# Patient Record
Sex: Female | Born: 1980 | Race: Black or African American | Hispanic: No | Marital: Single | Smoking: Current every day smoker
Health system: Southern US, Community
[De-identification: ages and names within clinical notes are randomized; demographics above are authoritative.]

## PROBLEM LIST (undated history)

## (undated) DIAGNOSIS — N739 Female pelvic inflammatory disease, unspecified: Secondary | ICD-10-CM

## (undated) DIAGNOSIS — R569 Unspecified convulsions: Secondary | ICD-10-CM

## (undated) HISTORY — PX: OTHER SURGICAL HISTORY: SHX169

## (undated) HISTORY — PX: TUBAL LIGATION: SHX77

## (undated) NOTE — ED Triage Notes (Signed)
 Formatting of this note might be different from the original. Patient thinks she has a pinched nerve in her right hip that is causing pain that radiates down her right leg it started last week but was on a cruise and unable to be seen . Pain increases with sitting Electronically signed by Genia Mering, RN at 10/26/2021 11:10 AM EST

## (undated) NOTE — ED Provider Notes (Signed)
 Formatting of this note is different from the original. HPI 38 year old female presents ED complaining of right lateral hip pain radiating to the lateral thigh and lateral proximal lower leg.  No weakness or numbness lower extremity.  No fall or trauma.  Patient awoke with this discomfort after an extended day of activity walking and caring for her children.  Similar symptoms in past.  History reviewed. No pertinent past medical history.  History reviewed. No pertinent surgical history.  History reviewed. No pertinent family history.  Social History   Socioeconomic History   Marital status: UNKNOWN    Spouse name: Not on file   Number of children: Not on file   Years of education: Not on file   Highest education level: Not on file  Occupational History   Not on file  Tobacco Use   Smoking status: Every Day    Types: Cigarettes   Smokeless tobacco: Never  Substance and Sexual Activity   Alcohol use: Yes    Comment: occasionally   Drug use: Never   Sexual activity: Not on file  Other Topics Concern   Not on file  Social History Narrative   Not on file   Social Determinants of Health   Financial Resource Strain: Not on file  Food Insecurity: Not on file  Transportation Needs: Not on file  Physical Activity: Not on file  Stress: Not on file  Social Connections: Not on file  Intimate Partner Violence: Not on file  Housing Stability: Not on file   ALLERGIES: Patient has no known allergies.  Review of Systems  All other systems reviewed and are negative.  Vitals:   10/26/21 1111  BP: (!) 164/90  Pulse: 89  Resp: 22  Temp: 98.6 F (37 C)  SpO2: 100%  Weight: 123.4 kg (272 lb)  Height: 5' 9 (1.753 m)   Middle-age AA female moderate discomfort Physical Exam Vitals and nursing note reviewed.  Constitutional:      General: She is not in acute distress.    Appearance: Normal appearance. She is normal weight. She is not ill-appearing.  HENT:     Head:  Normocephalic and atraumatic.  Abdominal:     General: Abdomen is flat. Bowel sounds are normal. There is no distension.     Palpations: Abdomen is soft.     Tenderness: There is no abdominal tenderness. There is no guarding.  Musculoskeletal:        General: Tenderness present. No swelling, deformity or signs of injury. Normal range of motion.     Cervical back: Normal range of motion and neck supple.     Comments: The right greater trochanter is tender to palpation flexion of the thigh with internal rotation causes significant pain; the hip joint otherwise has a normal range of motion there is no warmth erythema or inflammation the overlying skin.  The right knee is normal exam of the right ankle normal the lower extremity is otherwise neurovascular intact with no evidence of edema negative Homans no inner thigh tenderness  Neurological:     Mental Status: She is alert.    MDM Clinically with lateral band or greater trochanteric bursitis we will plan for steroid injection.   Procedures Injection of right greater trochanter bursa: Prepped with chlorhexidine verbal consent injected with 40 mg of Depo-Medrol mixed with 2 cc of Marcaine  patient tolerated well     Electronically signed by Claudene Helene BIRCH, MD at 10/26/2021  1:16 PM EST

---

## 2013-07-22 ENCOUNTER — Emergency Department (HOSPITAL_COMMUNITY)
Admission: EM | Admit: 2013-07-22 | Discharge: 2013-07-22 | Disposition: A | Discharge status: Home / Self Care | Payer: Medicaid Other | Attending: Emergency Medicine | Admitting: Emergency Medicine

## 2013-07-22 ENCOUNTER — Encounter (HOSPITAL_COMMUNITY): Payer: Self-pay | Admitting: Emergency Medicine

## 2013-07-22 DIAGNOSIS — F17200 Nicotine dependence, unspecified, uncomplicated: Secondary | ICD-10-CM | POA: Insufficient documentation

## 2013-07-22 DIAGNOSIS — Z3202 Encounter for pregnancy test, result negative: Secondary | ICD-10-CM | POA: Insufficient documentation

## 2013-07-22 DIAGNOSIS — Z8669 Personal history of other diseases of the nervous system and sense organs: Secondary | ICD-10-CM | POA: Insufficient documentation

## 2013-07-22 DIAGNOSIS — N390 Urinary tract infection, site not specified: Secondary | ICD-10-CM | POA: Insufficient documentation

## 2013-07-22 HISTORY — DX: Unspecified convulsions: R56.9

## 2013-07-22 LAB — URINALYSIS, ROUTINE W REFLEX MICROSCOPIC
Bilirubin Urine: NEGATIVE
Ketones, ur: NEGATIVE mg/dL
Nitrite: NEGATIVE
Protein, ur: NEGATIVE mg/dL
Urobilinogen, UA: 0.2 mg/dL (ref 0.0–1.0)

## 2013-07-22 MED ORDER — CEPHALEXIN 500 MG PO CAPS
500.0000 mg | ORAL_CAPSULE | Freq: Once | ORAL | Status: AC
  Administered 2013-07-22: 500 mg via ORAL
  Filled 2013-07-22: qty 1

## 2013-07-22 MED ORDER — FLUCONAZOLE 200 MG PO TABS: 200.0000 mg | ORAL_TABLET | Freq: Every day | ORAL | Status: DC

## 2013-07-22 MED ORDER — CEPHALEXIN 500 MG PO CAPS: 500.0000 mg | ORAL_CAPSULE | Freq: Two times a day (BID) | ORAL | Status: DC

## 2013-07-22 NOTE — ED Provider Notes (Signed)
CSN: [id number]     Arrival date & time 07/22/13  0631 History     First MD Initiated Contact with Patient 07/22/13 [phone]     Chief Complaint  Patient presents with  . Flank Pain   (Consider location/radiation/quality/duration/timing/severity/associated sxs/prior Treatment) HPI Comments: Bilateral flank pain, onset 2 days ago. Mild hematuria. Pain constant, changes with position changes, sharp. No radiation of pain, no vomiting or nausea. No fevers. Mild diarrhea.  LMP now.   Patient is a 32 y.o. female presenting with flank pain. The history is provided by the patient.  Flank Pain This is a new problem. The current episode started 2 days ago. The problem occurs constantly. The problem has been gradually worsening. Pertinent negatives include no chest pain, no abdominal pain, no headaches and no shortness of breath. Exacerbated by: position changes. Nothing relieves the symptoms. She has tried nothing for the symptoms. The treatment provided no relief.    Past Medical History  Diagnosis Date  . Seizures     epilepsy   Past Surgical History  Procedure Laterality Date  . L eye prothesis     No family history on file. History  Substance Use Topics  . Smoking status: Current Every Day Smoker  . Smokeless tobacco: Not on file  . Alcohol Use: No   OB History   Grav Para Term Preterm Abortions TAB SAB Ect Mult Living                 Review of Systems  Constitutional: Negative for fever and chills.  Respiratory: Negative for shortness of breath.   Cardiovascular: Negative for chest pain.  Gastrointestinal: Positive for diarrhea. Negative for nausea, vomiting, abdominal pain and blood in stool.  Genitourinary: Positive for flank pain.  Neurological: Negative for headaches.  All other systems reviewed and are negative.    Allergies  Review of patient's allergies indicates no known allergies.  Home Medications   Current Outpatient Rx  Name  Route  Sig  Dispense  Refill  .  aspirin EC 325 MG tablet   Oral   Take 325 mg by mouth every 6 (six) hours as needed for pain.         Marland Kitchen ibuprofen (ADVIL,MOTRIN) 200 MG tablet   Oral   Take 800-1,000 mg by mouth every 12 (twelve) hours as needed for pain.          BP 135/70  Pulse 64  Temp(Src) 98.1 F (36.7 C) (Oral)  Resp 18  Ht 5\' 9"  (1.753 m)  Wt 205 lb 6 oz (93.157 kg)  BMI 30.31 kg/m2  SpO2 100%  LMP 07/21/2013 Physical Exam  Nursing note and vitals reviewed. Constitutional: She is oriented to person, place, and time. She appears well-developed and well-nourished. No distress.  HENT:  Head: Normocephalic and atraumatic.  Eyes: EOM are normal. Pupils are equal, round, and reactive to light.  Neck: Normal range of motion. Neck supple.  Cardiovascular: Normal rate and regular rhythm.  Exam reveals no friction rub.   No murmur heard. Pulmonary/Chest: Effort normal and breath sounds normal. No respiratory distress. She has no wheezes. She has no rales.  Abdominal: Soft. She exhibits no distension. There is tenderness (mild bilateral CVA tenderness). There is no rebound.  Musculoskeletal: Normal range of motion. She exhibits no edema.  No midline back pain, no masses, no deformities  Neurological: She is alert and oriented to person, place, and time.  Skin: She is not diaphoretic.    ED Course  Procedures (including critical care time)  Labs Reviewed  URINALYSIS, ROUTINE W REFLEX MICROSCOPIC - Abnormal; Notable for the following:    Specific Gravity, Urine 1.035 (*)    Leukocytes, UA MODERATE (*)    All other components within normal limits  URINE MICROSCOPIC-ADD ON  POCT PREGNANCY, URINE   No results found. 1. UTI (urinary tract infection)     MDM   32 year old female presents with bilateral flank pain, mild hematuria for 2 days. She denies any fever, nausea, vomiting. She has constant pain with mild alleviation with position changes. She has no history of kidney stones. She states this  feels like a previous UTI. Patient denies any previous history of kidney stones. Vitals are stable here. Exam with some mild suprapubic tenderness, she states feels like cramping to her current menstrual period. She has mild bilateral CVA tenderness. No other back pain. Her pain does not appear consistent with kidney stones. We'll check urinalysis and pregnancy test. She's not been nauseated or vomiting, do not feel she needs IV fluids at this time. UA consistent with UTI with moderate leukocytes in 7-10 white cells. We'll give Keflex, first dose here and prescription for same.    Dagmar Hait, MD 07/22/13 [phone]

## 2013-07-22 NOTE — ED Notes (Signed)
Pt c/o bilat flank pain onset Thursday with hematuria. Denies abd pain at this time. +diarrhea. Denies fever.

## 2013-08-19 ENCOUNTER — Encounter (HOSPITAL_COMMUNITY): Payer: Self-pay

## 2013-08-19 ENCOUNTER — Emergency Department (HOSPITAL_COMMUNITY)
Admission: EM | Admit: 2013-08-19 | Discharge: 2013-08-19 | Disposition: A | Discharge status: Home / Self Care | Payer: Medicaid Other | Attending: Emergency Medicine | Admitting: Emergency Medicine

## 2013-08-19 DIAGNOSIS — R110 Nausea: Secondary | ICD-10-CM | POA: Insufficient documentation

## 2013-08-19 DIAGNOSIS — Z8742 Personal history of other diseases of the female genital tract: Secondary | ICD-10-CM | POA: Insufficient documentation

## 2013-08-19 DIAGNOSIS — F17200 Nicotine dependence, unspecified, uncomplicated: Secondary | ICD-10-CM | POA: Insufficient documentation

## 2013-08-19 DIAGNOSIS — R102 Pelvic and perineal pain: Secondary | ICD-10-CM

## 2013-08-19 DIAGNOSIS — Z3202 Encounter for pregnancy test, result negative: Secondary | ICD-10-CM | POA: Insufficient documentation

## 2013-08-19 DIAGNOSIS — N949 Unspecified condition associated with female genital organs and menstrual cycle: Secondary | ICD-10-CM | POA: Insufficient documentation

## 2013-08-19 DIAGNOSIS — N898 Other specified noninflammatory disorders of vagina: Secondary | ICD-10-CM

## 2013-08-19 DIAGNOSIS — Z8669 Personal history of other diseases of the nervous system and sense organs: Secondary | ICD-10-CM | POA: Insufficient documentation

## 2013-08-19 DIAGNOSIS — N390 Urinary tract infection, site not specified: Secondary | ICD-10-CM

## 2013-08-19 HISTORY — DX: Female pelvic inflammatory disease, unspecified: N73.9

## 2013-08-19 LAB — WET PREP, GENITAL
Trich, Wet Prep: NONE SEEN
Yeast Wet Prep HPF POC: NONE SEEN

## 2013-08-19 LAB — URINALYSIS, ROUTINE W REFLEX MICROSCOPIC
Bilirubin Urine: NEGATIVE
Hgb urine dipstick: NEGATIVE
Specific Gravity, Urine: 1.026 (ref 1.005–1.030)
pH: 7 (ref 5.0–8.0)

## 2013-08-19 LAB — URINE MICROSCOPIC-ADD ON

## 2013-08-19 LAB — POCT PREGNANCY, URINE: Preg Test, Ur: NEGATIVE

## 2013-08-19 MED ORDER — OXYCODONE-ACETAMINOPHEN 5-325 MG PO TABS
2.0000 | ORAL_TABLET | Freq: Once | ORAL | Status: AC
  Administered 2013-08-19: 2 via ORAL
  Filled 2013-08-19: qty 2

## 2013-08-19 MED ORDER — CEFTRIAXONE SODIUM 250 MG IJ SOLR
250.0000 mg | Freq: Once | INTRAMUSCULAR | Status: DC
  Filled 2013-08-19: qty 250

## 2013-08-19 MED ORDER — ONDANSETRON HCL 4 MG PO TABS: 4.0000 mg | ORAL_TABLET | Freq: Three times a day (TID) | ORAL | Status: DC | PRN

## 2013-08-19 MED ORDER — ONDANSETRON 4 MG PO TBDP
4.0000 mg | ORAL_TABLET | Freq: Once | ORAL | Status: AC
  Administered 2013-08-19: 4 mg via ORAL
  Filled 2013-08-19: qty 1

## 2013-08-19 MED ORDER — DOXYCYCLINE HYCLATE 100 MG PO CAPS: 100.0000 mg | ORAL_CAPSULE | Freq: Two times a day (BID) | ORAL | Status: DC

## 2013-08-19 MED ORDER — HYDROCODONE-ACETAMINOPHEN 5-325 MG PO TABS: 1.0000 | ORAL_TABLET | Freq: Four times a day (QID) | ORAL | Status: DC | PRN

## 2013-08-19 MED ORDER — AZITHROMYCIN 250 MG PO TABS
1000.0000 mg | ORAL_TABLET | Freq: Once | ORAL | Status: AC
  Administered 2013-08-19: 1000 mg via ORAL
  Filled 2013-08-19: qty 4

## 2013-08-19 MED ORDER — FLUCONAZOLE 150 MG PO TABS
150.0000 mg | ORAL_TABLET | Freq: Once | ORAL | Status: AC
  Administered 2013-08-19: 150 mg via ORAL
  Filled 2013-08-19: qty 1

## 2013-08-19 MED ORDER — CEFTRIAXONE SODIUM 1 G IJ SOLR
1.0000 g | Freq: Once | INTRAMUSCULAR | Status: AC
  Administered 2013-08-19: 1 g via INTRAMUSCULAR
  Filled 2013-08-19: qty 10

## 2013-08-19 NOTE — ED Notes (Signed)
Pt took 800mg  ibuprofen at 10am today w/o relief.

## 2013-08-19 NOTE — ED Provider Notes (Signed)
CSN: [id number]     Arrival date & time 08/19/13  1105 History   First MD Initiated Contact with Patient 08/19/13 1118     Chief Complaint  Patient presents with  . Pelvic Pain   (Consider location/radiation/quality/duration/timing/severity/associated sxs/prior Treatment) HPI Christy Meyer is a 32 year old female with a past medical history of pelvic inflammatory disease and seizures who presents emergency Department with chief complaint of pelvic pain.  Patient states her last infection of PID occurred approximately 11 years ago.  The patient ended her period yesterday.  She went to work today with some moderate pelvic pain.  She had to leave work due to increasing severe pain in her lower abdomen, distention.  She took 800 mg of ibuprofen without relief of her pain.  She has noticed some vaginal discharge but attributed this to normal changes of her period.  She states that this feels the same as her previous PID infection.  She complains of severe pain, nausea.  She denies any vomiting, urinary symptoms, fevers, myalgias or chills.  The patient is sexually active with one partner.  She does not use protection.  Past Medical History  Diagnosis Date  . Seizures     epilepsy  . PID (pelvic inflammatory disease)    Past Surgical History  Procedure Laterality Date  . Christy eye prothesis    . Tubal ligation     No family history on file. History  Substance Use Topics  . Smoking status: Current Every Day Smoker -- 1.00 packs/day    Types: Cigarettes  . Smokeless tobacco: Not on file  . Alcohol Use: Yes     Comment: occasionally   OB History   Grav Para Term Preterm Abortions TAB SAB Ect Mult Living                 Review of Systems  Ten systems reviewed and are negative for acute change, except as noted in the HPI.   Allergies  Review of patient's allergies indicates no known allergies.  Home Medications   Current Outpatient Rx  Name  Route  Sig  Dispense  Refill  . aspirin  325 MG tablet   Oral   Take 650 mg by mouth every 6 (six) hours as needed for pain.         Marland Kitchen ibuprofen (ADVIL,MOTRIN) 200 MG tablet   Oral   Take 800 mg by mouth every 8 (eight) hours as needed for pain.           BP 143/82  Pulse 71  Temp(Src) 98.3 F (36.8 C) (Oral)  Resp 20  Ht 5\' 9"  (1.753 m)  Wt 200 lb (90.719 kg)  BMI 29.52 kg/m2  SpO2 98%  LMP 08/14/2013 Physical Exam Physical Exam  Nursing note and vitals reviewed. Constitutional: She is oriented to person, place, and time. She appears well-developed.  Patient appears very uncomfortable.   HENT:  Head: Normocephalic and atraumatic.  Eyes: Right eye PERRLA.  Tracks well.  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 Neck: Normal range of motion.  Cardiovascular: Normal rate, regular rhythm and normal heart sounds.  Exam reveals no gallop and no friction rub.   No murmur heard. Pulmonary/Chest: Effort normal and breath sounds normal. No respiratory distress.  Abdominal: Soft. Bowel sounds are normal.  Distention of the lower abdomen.  She is exquisitely tender to palpation. Neurological: She is alert and oriented to person, place, and time.  Skin: Skin is warm and dry. She is not diaphoretic.  Pelvic  exam: normal external genitalia, vulva, vagina, cervix, uterus and adnexa, VULVA: normal appearing vulva with no masses, tenderness or lesions, VAGINA: vaginal discharge - white and thick, DNA probe for chlamydia and GC obtained, CERVIX: friable , cervical discharge present - bloody, green and mucoid, cervical motion tenderness present, multiparous os, ADNEXA: tenderness bilateral, no masses, exam chaperoned.   ED Course  Procedures (including critical care time) Labs Review Labs Reviewed  WET PREP, GENITAL - Abnormal; Notable for the following:    Clue Cells Wet Prep HPF POC FEW (*)    WBC, Wet Prep HPF POC MANY (*)    All other components within normal limits  URINALYSIS, ROUTINE W REFLEX MICROSCOPIC - Abnormal; Notable for the  following:    Nitrite POSITIVE (*)    Leukocytes, UA MODERATE (*)    All other components within normal limits  URINE MICROSCOPIC-ADD ON - Abnormal; Notable for the following:    Bacteria, UA FEW (*)    All other components within normal limits  URINE CULTURE  POCT PREGNANCY, URINE   Imaging Review No results found.  MDM   1. Pelvic pain   2. Vaginal discharge   3. UTI (lower urinary tract infection)    12:35 PM Patient with sxs concerning for PID. Treated with rocephin IM, azithromycin, and pain medicine. wetmount is pending   12:42 PM Patient Urine appears infected. I have changed rocephin dose form 250 mg to 1 g IM.    1:17 PM Patient wet prep with few clue cells. I will d/c patient with doxy for possible PID, zofran and pain medicine. Diflucan given here in the ED as patient gets yeast infection. Follow up with community [facility]. See  AVS dfor further instructions  Arthor Captain, PA-C 08/19/13 1320

## 2013-08-19 NOTE — ED Notes (Signed)
Family at bedside. Pt resting in bed.  Call bell in reach.

## 2013-08-19 NOTE — ED Notes (Signed)
Bed: WA08 Expected date: 08/19/13 Expected time: 11:03 AM Means of arrival:  Comments: Pelvic pain

## 2013-08-19 NOTE — ED Notes (Addendum)
Per EMS report: Pt c/o pelvic and general abd pain w/hx of PID.  No n/v/d, fevers or other symptoms.  VSS  140/p, 98, 20.

## 2013-08-19 NOTE — ED Provider Notes (Signed)
Medical screening examination/treatment/procedure(s) were performed by non-physician practitioner and as supervising physician I was immediately available for consultation/collaboration.   Ariannie Penaloza T Annjanette Wertenberger, MD 08/19/13 1729 

## 2013-08-21 LAB — URINE CULTURE: Colony Count: >=100000

## 2013-08-22 ENCOUNTER — Telehealth (HOSPITAL_COMMUNITY): Payer: Self-pay | Admitting: *Deleted

## 2013-08-22 NOTE — Progress Notes (Signed)
ED Antimicrobial Stewardship Positive Culture Follow Up   Christy Meyer is an 32 y.o. female who presented to [facility] on 08/19/2013 with a chief complaint of  Chief Complaint  Patient presents with  . Pelvic Pain    Recent Results (from the past 720 hour(s))  URINE CULTURE     Status: None   Collection Time    08/19/13 11:40 AM      Result Value Range Status   Specimen Description URINE, CLEAN CATCH   Final   Special Requests NONE   Final   Culture  Setup Time     Final   Value: 08/19/2013 17:28     Performed at [facility] Count     Final   Value: >=100,000 COLONIES/ML     Performed at [facility]   Culture     Final   Value: ESCHERICHIA COLI     Performed at [facility]   Report Status 08/21/2013 FINAL   Final   Organism ID, Bacteria ESCHERICHIA COLI   Final  WET PREP, GENITAL     Status: Abnormal   Collection Time    08/19/13 12:19 PM      Result Value Range Status   Yeast Wet Prep HPF POC NONE SEEN  NONE SEEN Final   Trich, Wet Prep NONE SEEN  NONE SEEN Final   Clue Cells Wet Prep HPF POC FEW (*) NONE SEEN Final   WBC, Wet Prep HPF POC MANY (*) NONE SEEN Final     [x]  Patient discharged originally without antimicrobial agent and treatment is now indicated  New antibiotic prescription: Cephalexin 500mg  po TID x 7 days  ED Provider: Trixie Dredge, PAC   Mickeal Skinner 08/22/2013, 9:39 AM Infectious Diseases Pharmacist Phone# [phone]

## 2013-08-22 NOTE — ED Notes (Signed)
Chart returned from EDP office  + Urine Please call in Keflex 500 TID x 7 days written by Trixie Dredge.

## 2014-02-27 ENCOUNTER — Encounter (HOSPITAL_COMMUNITY): Payer: Self-pay | Admitting: Emergency Medicine

## 2014-02-27 ENCOUNTER — Emergency Department (HOSPITAL_COMMUNITY)
Admission: EM | Admit: 2014-02-27 | Discharge: 2014-02-27 | Disposition: A | Discharge status: Home / Self Care | Payer: Medicaid Other | Attending: Emergency Medicine | Admitting: Emergency Medicine

## 2014-02-27 DIAGNOSIS — R0789 Other chest pain: Secondary | ICD-10-CM | POA: Insufficient documentation

## 2014-02-27 DIAGNOSIS — N390 Urinary tract infection, site not specified: Secondary | ICD-10-CM | POA: Insufficient documentation

## 2014-02-27 DIAGNOSIS — R002 Palpitations: Secondary | ICD-10-CM | POA: Insufficient documentation

## 2014-02-27 DIAGNOSIS — Z3202 Encounter for pregnancy test, result negative: Secondary | ICD-10-CM | POA: Insufficient documentation

## 2014-02-27 DIAGNOSIS — R42 Dizziness and giddiness: Secondary | ICD-10-CM | POA: Insufficient documentation

## 2014-02-27 DIAGNOSIS — Z8669 Personal history of other diseases of the nervous system and sense organs: Secondary | ICD-10-CM | POA: Insufficient documentation

## 2014-02-27 DIAGNOSIS — R0602 Shortness of breath: Secondary | ICD-10-CM | POA: Insufficient documentation

## 2014-02-27 DIAGNOSIS — F410 Panic disorder [episodic paroxysmal anxiety] without agoraphobia: Secondary | ICD-10-CM | POA: Insufficient documentation

## 2014-02-27 LAB — URINALYSIS, ROUTINE W REFLEX MICROSCOPIC
Bilirubin Urine: NEGATIVE
GLUCOSE, UA: NEGATIVE mg/dL
HGB URINE DIPSTICK: NEGATIVE
Ketones, ur: NEGATIVE mg/dL
Nitrite: NEGATIVE
Protein, ur: NEGATIVE mg/dL
SPECIFIC GRAVITY, URINE: 1.022 (ref 1.005–1.030)
Urobilinogen, UA: 1 mg/dL (ref 0.0–1.0)
pH: 8.5 — ABNORMAL HIGH (ref 5.0–8.0)

## 2014-02-27 LAB — CBC
HEMATOCRIT: 36.9 % (ref 36.0–46.0)
HEMOGLOBIN: 12.3 g/dL (ref 12.0–15.0)
MCH: 25.1 pg — ABNORMAL LOW (ref 26.0–34.0)
MCHC: 33.3 g/dL (ref 30.0–36.0)
MCV: 75.3 fL — ABNORMAL LOW (ref 78.0–100.0)
Platelets: 287 10*3/uL (ref 150–400)
RBC: 4.9 MIL/uL (ref 3.87–5.11)
RDW: 14.3 % (ref 11.5–15.5)
WBC: 7.3 10*3/uL (ref 4.0–10.5)

## 2014-02-27 LAB — BASIC METABOLIC PANEL
BUN: 11 mg/dL (ref 6–23)
CHLORIDE: 103 meq/L (ref 96–112)
CO2: 22 mEq/L (ref 19–32)
Calcium: 9.4 mg/dL (ref 8.4–10.5)
Creatinine, Ser: 1.03 mg/dL (ref 0.50–1.10)
GFR calc Af Amer: 82 mL/min — ABNORMAL LOW (ref >90)
GFR, EST NON AFRICAN AMERICAN: 71 mL/min — AB (ref >90)
GLUCOSE: 109 mg/dL — AB (ref 70–99)
POTASSIUM: 3.7 meq/L (ref 3.7–5.3)
Sodium: 138 mEq/L (ref 137–147)

## 2014-02-27 LAB — URINE MICROSCOPIC-ADD ON

## 2014-02-27 LAB — I-STAT TROPONIN, ED: Troponin i, poc: 0 ng/mL (ref 0.00–0.08)

## 2014-02-27 LAB — POC URINE PREG, ED: PREG TEST UR: NEGATIVE

## 2014-02-27 MED ORDER — ACETAMINOPHEN 325 MG PO TABS
650.0000 mg | ORAL_TABLET | Freq: Once | ORAL | Status: AC
  Administered 2014-02-27: 650 mg via ORAL
  Filled 2014-02-27: qty 2

## 2014-02-27 MED ORDER — AMOXICILLIN 500 MG PO CAPS
500.0000 mg | ORAL_CAPSULE | Freq: Two times a day (BID) | ORAL | Status: DC
Start: 2014-02-27 — End: 2014-04-05

## 2014-02-27 NOTE — Discharge Instructions (Signed)
Urinary Tract Infection Urinary tract infections (UTIs) can develop anywhere along your urinary tract. Your urinary tract is your body's drainage system for removing wastes and extra water. Your urinary tract includes two kidneys, two ureters, a bladder, and a urethra. Your kidneys are a pair of bean-shaped organs. Each kidney is about the size of your fist. They are located below your ribs, one on each side of your spine. CAUSES Infections are caused by microbes, which are microscopic organisms, including fungi, viruses, and bacteria. These organisms are so small that they can only be seen through a microscope. Bacteria are the microbes that most commonly cause UTIs. SYMPTOMS  Symptoms of UTIs may vary by age and gender of the patient and by the location of the infection. Symptoms in young women typically include a frequent and intense urge to urinate and a painful, burning feeling in the bladder or urethra during urination. Older women and men are more likely to be tired, shaky, and weak and have muscle aches and abdominal pain. A fever may mean the infection is in your kidneys. Other symptoms of a kidney infection include pain in your back or sides below the ribs, nausea, and vomiting. DIAGNOSIS To diagnose a UTI, your caregiver will ask you about your symptoms. Your caregiver also will ask to provide a urine sample. The urine sample will be tested for bacteria and white blood cells. White blood cells are made by your body to help fight infection. TREATMENT  Typically, UTIs can be treated with medication. Because most UTIs are caused by a bacterial infection, they usually can be treated with the use of antibiotics. The choice of antibiotic and length of treatment depend on your symptoms and the type of bacteria causing your infection. HOME CARE INSTRUCTIONS  If you were prescribed antibiotics, take them exactly as your caregiver instructs you. Finish the medication even if you feel better after you  have only taken some of the medication.  Drink enough water and fluids to keep your urine clear or pale yellow.  Avoid caffeine, tea, and carbonated beverages. They tend to irritate your bladder.  Empty your bladder often. Avoid holding urine for long periods of time.  Empty your bladder before and after sexual intercourse.  After a bowel movement, women should cleanse from front to back. Use each tissue only once. SEEK MEDICAL CARE IF:   You have back pain.  You develop a fever.  Your symptoms do not begin to resolve within 3 days. SEEK IMMEDIATE MEDICAL CARE IF:   You have severe back pain or lower abdominal pain.  You develop chills.  You have nausea or vomiting.  You have continued burning or discomfort with urination. MAKE SURE YOU:   Understand these instructions.  Will watch your condition.  Will get help right away if you are not doing well or get worse. Document Released: 09/16/2005 Document Revised: 06/07/2012 Document Reviewed: 01/15/2012 [facility] Patient Information 2014 [employer], [place]. Panic Attacks Panic attacks are sudden, short-livedsurges of severe anxiety, fear, or discomfort. They may occur for no reason when you are relaxed, when you are anxious, or when you are sleeping. Panic attacks may occur for a number of reasons:   Healthy people occasionally have panic attacks in extreme, life-threatening situations, such as war or natural disasters. Normal anxiety is a protective mechanism of the body that helps us react to danger (fight or flight response).  Panic attacks are often seen with anxiety disorders, such as panic disorder, social anxiety disorder, generalized anxiety  disorder, and phobias. Anxiety disorders cause excessive or uncontrollable anxiety. They may interfere with your relationships or other life activities.  Panic attacks are sometimes seen with other mental illnesses such as depression and posttraumatic stress disorder.  Certain  medical conditions, prescription medicines, and drugs of abuse can cause panic attacks. SYMPTOMS  Panic attacks start suddenly, peak within 20 minutes, and are accompanied by four or more of the following symptoms:  Pounding heart or fast heart rate (palpitations).  Sweating.  Trembling or shaking.  Shortness of breath or feeling smothered.  Feeling choked.  Chest pain or discomfort.  Nausea or strange feeling in your stomach.  Dizziness, lightheadedness, or feeling like you will faint.  Chills or hot flushes.  Numbness or tingling in your lips or hands and feet.  Feeling that things are not real or feeling that you are not yourself.  Fear of losing control or going crazy.  Fear of dying. Some of these symptoms can mimic serious medical conditions. For example, you may think you are having a heart attack. Although panic attacks can be very scary, they are not life threatening. DIAGNOSIS  Panic attacks are diagnosed through an assessment by your health care provider. Your health care provider will ask questions about your symptoms, such as where and when they occurred. Your health care provider will also ask about your medical history and use of alcohol and drugs, including prescription medicines. Your health care provider may order blood tests or other studies to rule out a serious medical condition. Your health care provider may refer you to a mental health professional for further evaluation. TREATMENT   Most healthy people who have one or two panic attacks in an extreme, life-threatening situation will not require treatment.  The treatment for panic attacks associated with anxiety disorders or other mental illness typically involves counseling with a mental health professional, medicine, or a combination of both. Your health care provider will help determine what treatment is best for you.  Panic attacks due to physical illness usually goes away with treatment of the illness.  If prescription medicine is causing panic attacks, talk with your health care provider about stopping the medicine, decreasing the dose, or substituting another medicine.  Panic attacks due to alcohol or drug abuse goes away with abstinence. Some adults need professional help in order to stop drinking or using drugs. HOME CARE INSTRUCTIONS   Take all your medicines as prescribed.   Check with your health care provider before starting new prescription or over-the-counter medicines.  Keep all follow up appointments with your health care provider. SEEK MEDICAL CARE IF:  You are not able to take your medicines as prescribed.  Your symptoms do not improve or get worse. SEEK IMMEDIATE MEDICAL CARE IF:   You experience panic attack symptoms that are different than your usual symptoms.  You have serious thoughts about hurting yourself or others.  You are taking medicine for panic attacks and have a serious side effect. MAKE SURE YOU:  Understand these instructions.  Will watch your condition.  Will get help right away if you are not doing well or get worse. Document Released: 12/07/2005 Document Revised: 09/27/2013 Document Reviewed: 07/21/2013 [facility] Patient Information 2014 [place], [place].
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
## 2014-03-12 NOTE — ED Provider Notes (Signed)
I saw and evaluated the patient, reviewed the resident's note and I agree with the findings and plan.   .Face to face Exam:  General:  Awake HEENT:  Atraumatic Resp:  Normal effort Abd:  Nondistended Neuro:No focal weakness   Armonee Bojanowski L Zasha Belleau, MD 03/12/14 1129 

## 2014-04-05 ENCOUNTER — Encounter (HOSPITAL_COMMUNITY): Payer: Self-pay | Admitting: Emergency Medicine

## 2014-04-05 ENCOUNTER — Emergency Department (HOSPITAL_COMMUNITY)
Admission: EM | Admit: 2014-04-05 | Discharge: 2014-04-05 | Disposition: A | Discharge status: Home / Self Care | Payer: No Typology Code available for payment source | Attending: Emergency Medicine | Admitting: Emergency Medicine

## 2014-04-05 DIAGNOSIS — M542 Cervicalgia: Secondary | ICD-10-CM

## 2014-04-05 DIAGNOSIS — S0990XA Unspecified injury of head, initial encounter: Secondary | ICD-10-CM | POA: Insufficient documentation

## 2014-04-05 DIAGNOSIS — S4980XA Other specified injuries of shoulder and upper arm, unspecified arm, initial encounter: Secondary | ICD-10-CM | POA: Insufficient documentation

## 2014-04-05 DIAGNOSIS — Z8742 Personal history of other diseases of the female genital tract: Secondary | ICD-10-CM | POA: Insufficient documentation

## 2014-04-05 DIAGNOSIS — Y92410 Unspecified street and highway as the place of occurrence of the external cause: Secondary | ICD-10-CM | POA: Insufficient documentation

## 2014-04-05 DIAGNOSIS — S0993XA Unspecified injury of face, initial encounter: Secondary | ICD-10-CM | POA: Insufficient documentation

## 2014-04-05 DIAGNOSIS — Z79899 Other long term (current) drug therapy: Secondary | ICD-10-CM | POA: Insufficient documentation

## 2014-04-05 DIAGNOSIS — F17200 Nicotine dependence, unspecified, uncomplicated: Secondary | ICD-10-CM | POA: Insufficient documentation

## 2014-04-05 DIAGNOSIS — M549 Dorsalgia, unspecified: Secondary | ICD-10-CM

## 2014-04-05 DIAGNOSIS — Y9389 Activity, other specified: Secondary | ICD-10-CM | POA: Insufficient documentation

## 2014-04-05 DIAGNOSIS — IMO0002 Reserved for concepts with insufficient information to code with codable children: Secondary | ICD-10-CM | POA: Insufficient documentation

## 2014-04-05 DIAGNOSIS — Z8669 Personal history of other diseases of the nervous system and sense organs: Secondary | ICD-10-CM | POA: Insufficient documentation

## 2014-04-05 DIAGNOSIS — S46909A Unspecified injury of unspecified muscle, fascia and tendon at shoulder and upper arm level, unspecified arm, initial encounter: Secondary | ICD-10-CM | POA: Insufficient documentation

## 2014-04-05 DIAGNOSIS — S199XXA Unspecified injury of neck, initial encounter: Principal | ICD-10-CM

## 2014-04-05 MED ORDER — IBUPROFEN 800 MG PO TABS: 800.0000 mg | ORAL_TABLET | Freq: Three times a day (TID) | ORAL | Status: DC | PRN

## 2014-04-05 MED ORDER — IBUPROFEN 800 MG PO TABS
800.0000 mg | ORAL_TABLET | Freq: Once | ORAL | Status: AC
  Administered 2014-04-05: 800 mg via ORAL
  Filled 2014-04-05: qty 1

## 2014-04-05 MED ORDER — CYCLOBENZAPRINE HCL 10 MG PO TABS: 10.0000 mg | ORAL_TABLET | Freq: Three times a day (TID) | ORAL | Status: DC | PRN

## 2014-04-05 NOTE — ED Provider Notes (Signed)
Medical screening examination/treatment/procedure(s) were performed by non-physician practitioner and as supervising physician I was immediately available for consultation/collaboration.   EKG Interpretation None        Cherre Kothari M Maison Kestenbaum, MD 04/05/14 1553 
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
## 2014-04-11 ENCOUNTER — Other Ambulatory Visit (HOSPITAL_COMMUNITY)
Admission: RE | Admit: 2014-04-11 | Discharge: 2014-04-11 | Disposition: A | Discharge status: Home / Self Care | Payer: Medicaid Other | Source: Ambulatory Visit | Attending: Family Medicine | Admitting: Family Medicine

## 2014-04-11 ENCOUNTER — Encounter (HOSPITAL_COMMUNITY): Payer: Self-pay | Admitting: Emergency Medicine

## 2014-04-11 ENCOUNTER — Emergency Department (INDEPENDENT_AMBULATORY_CARE_PROVIDER_SITE_OTHER)
Admission: EM | Admit: 2014-04-11 | Discharge: 2014-04-11 | Disposition: A | Discharge status: Home / Self Care | Payer: Medicaid Other | Source: Home / Self Care | Attending: Family Medicine | Admitting: Family Medicine

## 2014-04-11 DIAGNOSIS — N760 Acute vaginitis: Secondary | ICD-10-CM

## 2014-04-11 DIAGNOSIS — N898 Other specified noninflammatory disorders of vagina: Secondary | ICD-10-CM

## 2014-04-11 DIAGNOSIS — Z113 Encounter for screening for infections with a predominantly sexual mode of transmission: Secondary | ICD-10-CM | POA: Insufficient documentation

## 2014-04-11 LAB — POCT URINALYSIS DIP (DEVICE)
Bilirubin Urine: NEGATIVE
GLUCOSE, UA: NEGATIVE mg/dL
Ketones, ur: NEGATIVE mg/dL
NITRITE: POSITIVE — AB
Protein, ur: NEGATIVE mg/dL
Specific Gravity, Urine: >=1.03 (ref 1.005–1.030)
Urobilinogen, UA: 0.2 mg/dL (ref 0.0–1.0)
pH: 5.5 (ref 5.0–8.0)

## 2014-04-11 LAB — POCT PREGNANCY, URINE: PREG TEST UR: NEGATIVE

## 2014-04-11 LAB — RPR

## 2014-04-11 MED ORDER — FLUCONAZOLE 150 MG PO TABS: 150.0000 mg | ORAL_TABLET | Freq: Once | ORAL | Status: DC

## 2014-04-11 MED ORDER — VALACYCLOVIR HCL 1 G PO TABS: 1000.0000 mg | ORAL_TABLET | Freq: Two times a day (BID) | ORAL | Status: AC

## 2014-04-11 MED ORDER — CEPHALEXIN 500 MG PO CAPS: 500.0000 mg | ORAL_CAPSULE | Freq: Two times a day (BID) | ORAL | Status: DC

## 2014-04-11 MED ORDER — METRONIDAZOLE 500 MG PO TABS
500.0000 mg | ORAL_TABLET | Freq: Two times a day (BID) | ORAL | Status: DC
Start: 2014-04-11 — End: 2018-12-17

## 2014-04-11 NOTE — Discharge Instructions (Signed)
Thank you for coming in today. We will call with results if positive.  If your belly pain worsens, or you have high fever, bad vomiting, blood in your stool or black tarry stool go to the Emergency Room.   Bacterial Vaginosis Bacterial vaginosis is a vaginal infection that occurs when the normal balance of bacteria in the vagina is disrupted. It results from an overgrowth of certain bacteria. This is the most common vaginal infection in women of childbearing age. Treatment is important to prevent complications, especially in pregnant women, as it can cause a premature delivery. CAUSES  Bacterial vaginosis is caused by an increase in harmful bacteria that are normally present in smaller amounts in the vagina. Several different kinds of bacteria can cause bacterial vaginosis. However, the reason that the condition develops is not fully understood. RISK FACTORS Certain activities or behaviors can put you at an increased risk of developing bacterial vaginosis, including:  Having a new sex partner or multiple sex partners.  Douching.  Using an intrauterine device (IUD) for contraception. Women do not get bacterial vaginosis from toilet seats, bedding, swimming pools, or contact with objects around them. SIGNS AND SYMPTOMS  Some women with bacterial vaginosis have no signs or symptoms. Common symptoms include:  Grey vaginal discharge.  A fishlike odor with discharge, especially after sexual intercourse.  Itching or burning of the vagina and vulva.  Burning or pain with urination. DIAGNOSIS  Your health care provider will take a medical history and examine the vagina for signs of bacterial vaginosis. A sample of vaginal fluid may be taken. Your health care provider will look at this sample under a microscope to check for bacteria and abnormal cells. A vaginal pH test may also be done.  TREATMENT  Bacterial vaginosis may be treated with antibiotic medicines. These may be given in the form of a  pill or a vaginal cream. A second round of antibiotics may be prescribed if the condition comes back after treatment.  HOME CARE INSTRUCTIONS   Only take over-the-counter or prescription medicines as directed by your health care provider.  If antibiotic medicine was prescribed, take it as directed. Make sure you finish it even if you start to feel better.  Do not have sex until treatment is completed.  Tell all sexual partners that you have a vaginal infection. They should see their health care provider and be treated if they have problems, such as a mild rash or itching.  Practice safe sex by using condoms and only having one sex partner. SEEK MEDICAL CARE IF:   Your symptoms are not improving after 3 days of treatment.  You have increased discharge or pain.  You have a fever. MAKE SURE YOU:   Understand these instructions.  Will watch your condition.  Will get help right away if you are not doing well or get worse. FOR MORE INFORMATION  Centers for Disease Control and Prevention, Division of STD Prevention: SolutionApps.co.zawww.cdc.gov/std American Sexual Health Association (ASHA): www.ashastd.org  Document Released: 12/07/2005 Document Revised: 09/27/2013 Document Reviewed: 07/19/2013 [facility] Patient Information 2014 [place]Care, [place]. Genital Herpes Genital herpes is a sexually transmitted disease. This means that it is a disease passed by having sex with an infected person. There is no cure for genital herpes. The time between attacks can be months to years. The virus may live in a person but produce no problems (symptoms). This infection can be passed to a baby as it travels down the birth canal (vagina). In a newborn, this can  cause central nervous system damage, eye damage, or even death. The virus that causes genital herpes is usually HSV-2 virus. The virus that causes oral herpes is usually HSV-1. The diagnosis (learning what is wrong) is made through culture results. SYMPTOMS  Usually  symptoms of pain and itching begin a few days to a week after contact. It first appears as small blisters that progress to small painful ulcers which then scab over and heal after several days. It affects the outer genitalia, birth canal, cervix, penis, anal area, buttocks, and thighs. HOME CARE INSTRUCTIONS   Keep ulcerated areas dry and clean.  Take medications as directed. Antiviral medications can speed up healing. They will not prevent recurrences or cure this infection. These medications can also be taken for suppression if there are frequent recurrences.  While the infection is active, it is contagious. Avoid all sexual contact during active infections.  Condoms may help prevent spread of the herpes virus.  Practice safe sex.  Wash your hands thoroughly after touching the genital area.  Avoid touching your eyes after touching your genital area.  Inform your caregiver if you have had genital herpes and become pregnant. It is your responsibility to insure a safe outcome for your baby in this pregnancy.  Only take over-the-counter or prescription medicines for pain, discomfort, or fever as directed by your caregiver. SEEK MEDICAL CARE IF:   You have a recurrence of this infection.  You do not respond to medications and are not improving.  You have new sources of pain or discharge which have changed from the original infection.  You have an oral temperature above 102 F (38.9 C).  You develop abdominal pain.  You develop eye pain or signs of eye infection. Document Released: 12/04/2000 Document Revised: 02/29/2012 Document Reviewed: 12/25/2009 [facility] Patient Information 2014 [place]Exit[place], [place].

## 2014-04-11 NOTE — ED Notes (Signed)
Pt c/o white vag d/c onset 7 days Sx also include a painful sore  Denies f/v/n/d, urinary sx, abd/back pain Alert w/no signs of acute distress.

## 2014-04-11 NOTE — ED Provider Notes (Signed)
Shaune PollackLetitia Schey is a 33 y.o. female who presents to Urgent Care today for vaginal discharge. She's experienced vaginal discharge for about one week. She notes an odor. She additionally notes a painful vaginal sore on the right side of her labia. She denies any history of herpes. The sore is been present for the last 3 days. She feels well otherwise. She has not tried any medications. No fevers chills nausea vomiting or diarrhea. Mild dysuria present. No significant pelvic pain.  Past Medical History  Diagnosis Date  . Seizures     epilepsy  . PID (pelvic inflammatory disease)    History  Substance Use Topics  . Smoking status: Current Every Day Smoker -- 1.00 packs/day    Types: Cigarettes  . Smokeless tobacco: Not on file  . Alcohol Use: Yes     Comment: occasionally   ROS as above Medications: No current facility-administered medications for this encounter.   Current Outpatient Prescriptions  Medication Sig Dispense Refill  . acetaminophen (TYLENOL) 500 MG tablet Take 500 mg by mouth every 6 (six) hours as needed for mild pain or headache.      . cephALEXin (KEFLEX) 500 MG capsule Take 1 capsule (500 mg total) by mouth 2 (two) times daily.  14 capsule  0  . CRANBERRY PO Take 1 tablet by mouth daily.      . cyclobenzaprine (FLEXERIL) 10 MG tablet Take 1 tablet (10 mg total) by mouth 3 (three) times daily as needed for muscle spasms (or pain).  15 tablet  0  . ibuprofen (ADVIL,MOTRIN) 200 MG tablet Take 200 mg by mouth every 8 (eight) hours as needed for pain.       . metroNIDAZOLE (FLAGYL) 500 MG tablet Take 1 tablet (500 mg total) by mouth 2 (two) times daily.  14 tablet  0  . valACYclovir (VALTREX) 1000 MG tablet Take 1 tablet (1,000 mg total) by mouth 2 (two) times daily.  20 tablet  0    Exam:  BP 130/84  Pulse 76  Temp(Src) 98.7 F (37.1 C) (Oral)  Resp 18  SpO2 100%  LMP 03/25/2014 Gen: Well NAD HEENT: EOMI,  MMM Lungs: Normal work of breathing. CTABL Heart: RRR no  MRG Abd: NABS, Soft. NT, ND Exts: Brisk capillary refill, warm and well perfused.  GYN: External genitalia with a painful tender ulcer in the right inferior labia. Speculum exam was deferred.  Results for orders placed during the hospital encounter of 04/11/14 (from the past 24 hour(s))  POCT URINALYSIS DIP (DEVICE)     Status: Abnormal   Collection Time    04/11/14  5:32 PM      Result Value Ref Range   Glucose, UA NEGATIVE  NEGATIVE mg/dL   Bilirubin Urine NEGATIVE  NEGATIVE   Ketones, ur NEGATIVE  NEGATIVE mg/dL   Specific Gravity, Urine >=1.030  1.005 - 1.030   Hgb urine dipstick TRACE (*) NEGATIVE   pH 5.5  5.0 - 8.0   Protein, ur NEGATIVE  NEGATIVE mg/dL   Urobilinogen, UA 0.2  0.0 - 1.0 mg/dL   Nitrite POSITIVE (*) NEGATIVE   Leukocytes, UA SMALL (*) NEGATIVE  POCT PREGNANCY, URINE     Status: None   Collection Time    04/11/14  5:34 PM      Result Value Ref Range   Preg Test, Ur NEGATIVE  NEGATIVE   No results found.  Assessment and Plan: 33 y.o. female with  1) vaginal ulcer: Concerning for herpes. HSV culture pending.  Empiric treatment with valacyclovir.  2) vaginal discharge: Cytology pending. Empiric Treatment with fluconazole and metronidazol. HIV and RPR pending  Discussed warning signs or symptoms. Please see discharge instructions. Patient expresses understanding.    Rodolph BongEvan S Stina Gane, MD 04/11/14 [phone]

## 2014-04-12 LAB — CERVICOVAGINAL ANCILLARY ONLY
CHLAMYDIA, DNA PROBE: NEGATIVE
Neisseria Gonorrhea: NEGATIVE
WET PREP (BD AFFIRM): NEGATIVE
WET PREP (BD AFFIRM): NEGATIVE
WET PREP (BD AFFIRM): POSITIVE — AB

## 2014-04-12 LAB — HIV ANTIBODY (ROUTINE TESTING W REFLEX): HIV 1&2 Ab, 4th Generation: NONREACTIVE

## 2014-04-12 NOTE — ED Notes (Addendum)
GC/Chlamydia neg., Affrim: Candida and Trich neg., Gardnerella pos., HIV/RPR non-reactive, Herpes and urine cultures pending.  Pt. adequately treated with Flagyl Christy Meyer 04/12/2014 Herpes culture: No herpes detected, Urine culture: >100,000 colonies E. Coli.  Pt. adequately treated with Keflex. 04/14/2014

## 2014-04-13 LAB — HERPES SIMPLEX VIRUS CULTURE: Culture: NOT DETECTED

## 2014-04-14 ENCOUNTER — Telehealth (HOSPITAL_COMMUNITY): Payer: Self-pay | Admitting: *Deleted

## 2014-04-14 LAB — URINE CULTURE
Colony Count: >=100000
Special Requests: NORMAL

## 2014-04-14 NOTE — ED Notes (Signed)
I called pt. Pt. verified x 2 and given results.  Pt. told she was adequtely treated with Flagyl for bacterial vaginosis and Keflex for the UTI. Pt. instructed to finish all of medication. She asked about the Valtrex. Dr. Denyse Amassorey said she can stop that. Pt. notified. 04/14/2014

## 2015-01-01 ENCOUNTER — Encounter (HOSPITAL_COMMUNITY): Payer: Self-pay | Admitting: Emergency Medicine

## 2015-01-01 ENCOUNTER — Emergency Department (HOSPITAL_COMMUNITY)
Admission: EM | Admit: 2015-01-01 | Discharge: 2015-01-01 | Disposition: A | Discharge status: Home / Self Care | Payer: Medicaid Other | Attending: Emergency Medicine | Admitting: Emergency Medicine

## 2015-01-01 ENCOUNTER — Emergency Department (HOSPITAL_COMMUNITY): Payer: Medicaid Other

## 2015-01-01 DIAGNOSIS — Z720 Tobacco use: Secondary | ICD-10-CM | POA: Insufficient documentation

## 2015-01-01 DIAGNOSIS — J069 Acute upper respiratory infection, unspecified: Secondary | ICD-10-CM | POA: Insufficient documentation

## 2015-01-01 DIAGNOSIS — Z8742 Personal history of other diseases of the female genital tract: Secondary | ICD-10-CM | POA: Insufficient documentation

## 2015-01-01 DIAGNOSIS — Z3202 Encounter for pregnancy test, result negative: Secondary | ICD-10-CM | POA: Insufficient documentation

## 2015-01-01 DIAGNOSIS — M542 Cervicalgia: Secondary | ICD-10-CM | POA: Insufficient documentation

## 2015-01-01 DIAGNOSIS — B9789 Other viral agents as the cause of diseases classified elsewhere: Secondary | ICD-10-CM

## 2015-01-01 DIAGNOSIS — R059 Cough, unspecified: Secondary | ICD-10-CM

## 2015-01-01 DIAGNOSIS — R112 Nausea with vomiting, unspecified: Secondary | ICD-10-CM | POA: Insufficient documentation

## 2015-01-01 DIAGNOSIS — R05 Cough: Secondary | ICD-10-CM

## 2015-01-01 LAB — COMPREHENSIVE METABOLIC PANEL
ALBUMIN: 3.8 g/dL (ref 3.5–5.2)
ALT: 20 U/L (ref 0–35)
AST: 21 U/L (ref 0–37)
Alkaline Phosphatase: 84 U/L (ref 39–117)
Anion gap: 6 (ref 5–15)
BILIRUBIN TOTAL: 0.4 mg/dL (ref 0.3–1.2)
BUN: 8 mg/dL (ref 6–23)
CALCIUM: 8.7 mg/dL (ref 8.4–10.5)
CO2: 24 mmol/L (ref 19–32)
CREATININE: 0.9 mg/dL (ref 0.50–1.10)
Chloride: 107 mEq/L (ref 96–112)
GFR calc Af Amer: >90 mL/min (ref >90)
GFR calc non Af Amer: 82 mL/min — ABNORMAL LOW (ref >90)
Glucose, Bld: 111 mg/dL — ABNORMAL HIGH (ref 70–99)
Potassium: 3.8 mmol/L (ref 3.5–5.1)
Sodium: 137 mmol/L (ref 135–145)
Total Protein: 7.2 g/dL (ref 6.0–8.3)

## 2015-01-01 LAB — CBC WITH DIFFERENTIAL/PLATELET
BASOS ABS: 0 10*3/uL (ref 0.0–0.1)
Basophils Relative: 1 % (ref 0–1)
EOS ABS: 0.4 10*3/uL (ref 0.0–0.7)
Eosinophils Relative: 6 % — ABNORMAL HIGH (ref 0–5)
HCT: 38.4 % (ref 36.0–46.0)
HEMOGLOBIN: 12.1 g/dL (ref 12.0–15.0)
LYMPHS ABS: 3.1 10*3/uL (ref 0.7–4.0)
Lymphocytes Relative: 53 % — ABNORMAL HIGH (ref 12–46)
MCH: 24.2 pg — AB (ref 26.0–34.0)
MCHC: 31.5 g/dL (ref 30.0–36.0)
MCV: 76.8 fL — AB (ref 78.0–100.0)
Monocytes Absolute: 0.3 10*3/uL (ref 0.1–1.0)
Monocytes Relative: 6 % (ref 3–12)
Neutro Abs: 2 10*3/uL (ref 1.7–7.7)
Neutrophils Relative %: 34 % — ABNORMAL LOW (ref 43–77)
PLATELETS: 280 10*3/uL (ref 150–400)
RBC: 5 MIL/uL (ref 3.87–5.11)
RDW: 15.4 % (ref 11.5–15.5)
WBC: 5.9 10*3/uL (ref 4.0–10.5)

## 2015-01-01 LAB — URINE MICROSCOPIC-ADD ON

## 2015-01-01 LAB — URINALYSIS, ROUTINE W REFLEX MICROSCOPIC
BILIRUBIN URINE: NEGATIVE
GLUCOSE, UA: NEGATIVE mg/dL
HGB URINE DIPSTICK: NEGATIVE
Ketones, ur: NEGATIVE mg/dL
Nitrite: NEGATIVE
Protein, ur: NEGATIVE mg/dL
SPECIFIC GRAVITY, URINE: 1.023 (ref 1.005–1.030)
UROBILINOGEN UA: 1 mg/dL (ref 0.0–1.0)
pH: 6.5 (ref 5.0–8.0)

## 2015-01-01 LAB — POC URINE PREG, ED: PREG TEST UR: NEGATIVE

## 2015-01-01 LAB — LIPASE, BLOOD: Lipase: 28 U/L (ref 11–59)

## 2015-01-01 MED ORDER — BENZONATATE 100 MG PO CAPS: 100.0000 mg | ORAL_CAPSULE | Freq: Three times a day (TID) | ORAL | Status: AC

## 2015-01-01 MED ORDER — ALBUTEROL SULFATE HFA 108 (90 BASE) MCG/ACT IN AERS
2.0000 | INHALATION_SPRAY | Freq: Once | RESPIRATORY_TRACT | Status: AC
  Administered 2015-01-01: 2 via RESPIRATORY_TRACT
  Filled 2015-01-01: qty 6.7

## 2015-01-01 MED ORDER — IPRATROPIUM-ALBUTEROL 0.5-2.5 (3) MG/3ML IN SOLN
3.0000 mL | Freq: Once | RESPIRATORY_TRACT | Status: AC
  Administered 2015-01-01: 3 mL via RESPIRATORY_TRACT
  Filled 2015-01-01: qty 3

## 2015-01-01 MED ORDER — PREDNISONE 10 MG PO TABS: ORAL_TABLET | ORAL | Status: AC

## 2015-01-01 MED ORDER — PSEUDOEPHEDRINE HCL 30 MG PO TABS: 30.0000 mg | ORAL_TABLET | ORAL | Status: AC | PRN

## 2015-01-01 MED ORDER — HYDROCOD POLST-CHLORPHEN POLST 10-8 MG/5ML PO LQCR
5.0000 mL | Freq: Once | ORAL | Status: AC
  Administered 2015-01-01: 5 mL via ORAL
  Filled 2015-01-01: qty 5

## 2015-01-01 MED ORDER — PREDNISONE 20 MG PO TABS
60.0000 mg | ORAL_TABLET | Freq: Once | ORAL | Status: AC
  Administered 2015-01-01: 60 mg via ORAL
  Filled 2015-01-01: qty 3

## 2015-01-01 NOTE — ED Notes (Signed)
Pt c/o flu like sx, that started on Friday, chest congestion, vomiting that makes her pee on self, neck and throat pain.

## 2015-01-01 NOTE — Discharge Instructions (Signed)
Inhaler 2 puffs every 4 hrs. Prednisone until all gone. Tessalon for cough. Sudafed for congestion. Stop smoking. Follow up with primary care doctor.     Acute Bronchitis Bronchitis is inflammation of the airways that extend from the windpipe into the lungs (bronchi). The inflammation often causes mucus to develop. This leads to a cough, which is the most common symptom of bronchitis.  In acute bronchitis, the condition usually develops suddenly and goes away over time, usually in a couple weeks. Smoking, allergies, and asthma can make bronchitis worse. Repeated episodes of bronchitis may cause further lung problems.  CAUSES Acute bronchitis is most often caused by the same virus that causes a cold. The virus can spread from person to person (contagious) through coughing, sneezing, and touching contaminated objects. SIGNS AND SYMPTOMS   Cough.   Fever.   Coughing up mucus.   Body aches.   Chest congestion.   Chills.   Shortness of breath.   Sore throat.  DIAGNOSIS  Acute bronchitis is usually diagnosed through a physical exam. Your health care provider will also ask you questions about your medical history. Tests, such as chest X-rays, are sometimes done to rule out other conditions.  TREATMENT  Acute bronchitis usually goes away in a couple weeks. Oftentimes, no medical treatment is necessary. Medicines are sometimes given for relief of fever or cough. Antibiotic medicines are usually not needed but may be prescribed in certain situations. In some cases, an inhaler may be recommended to help reduce shortness of breath and control the cough. A cool mist vaporizer may also be used to help thin bronchial secretions and make it easier to clear the chest.  HOME CARE INSTRUCTIONS  Get plenty of rest.   Drink enough fluids to keep your urine clear or pale yellow (unless you have a medical condition that requires fluid restriction). Increasing fluids may help thin your respiratory  secretions (sputum) and reduce chest congestion, and it will prevent dehydration.   Take medicines only as directed by your health care provider.  If you were prescribed an antibiotic medicine, finish it all even if you start to feel better.  Avoid smoking and secondhand smoke. Exposure to cigarette smoke or irritating chemicals will make bronchitis worse. If you are a smoker, consider using nicotine gum or skin patches to help control withdrawal symptoms. Quitting smoking will help your lungs heal faster.   Reduce the chances of another bout of acute bronchitis by washing your hands frequently, avoiding people with cold symptoms, and trying not to touch your hands to your mouth, nose, or eyes.   Keep all follow-up visits as directed by your health care provider.  SEEK MEDICAL CARE IF: Your symptoms do not improve after 1 week of treatment.  SEEK IMMEDIATE MEDICAL CARE IF:  You develop an increased fever or chills.   You have chest pain.   You have severe shortness of breath.  You have bloody sputum.   You develop dehydration.  You faint or repeatedly feel like you are going to pass out.  You develop repeated vomiting.  You develop a severe headache. MAKE SURE YOU:   Understand these instructions.  Will watch your condition.  Will get help right away if you are not doing well or get worse. Document Released: 01/14/2005 Document Revised: 04/23/2014 Document Reviewed: 05/30/2013 [facility] Patient Information 2015 [place] MeetingExitCare, [place]LLC. This information is not intended to replace advice given to you by your health care provider. Make sure you discuss any questions you have with  your health care provider. ° °

## 2015-01-01 NOTE — ED Provider Notes (Signed)
CSN: [id number]     Arrival date & time 01/01/15  1731 History   First MD Initiated Contact with Patient 01/01/15 1743     Chief Complaint  Patient presents with  . flu like sx      (Consider location/radiation/quality/duration/timing/severity/associated sxs/prior Treatment) HPI Christy Meyer is a 34 y.o. female with history of seizures, presents to emergency department with flulike symptoms. She states her symptoms began 4 days ago. States she is having nasal congestion, sore throat, cough, nausea, posttussive emesis, states she is having urinary incontinence when coughing. She denies any fever or chills. She denies any myalgias. She has taken Robitussin with no relief of her symptoms. She denies any recent ill contacts. She denies any abdominal pain, no vomiting unless she is coughing. Denies any neck stiffness, however she states she has some right-sided neck pain. Denies any photophobia. Denies cp or sob. Current smoker.   Past Medical History  Diagnosis Date  . Seizures     epilepsy  . PID (pelvic inflammatory disease)    Past Surgical History  Procedure Laterality Date  . L eye prothesis    . Tubal ligation     Family History  Problem Relation Age of Onset  . Diabetes Mother   . Hypertension Mother    History  Substance Use Topics  . Smoking status: Current Every Day Smoker -- 1.00 packs/day    Types: Cigarettes  . Smokeless tobacco: Not on file  . Alcohol Use: Yes     Comment: occasionally   OB History    No data available     Review of Systems  Constitutional: Negative for fever and chills.  HENT: Positive for congestion and sore throat. Negative for ear pain, trouble swallowing and voice change.   Eyes: Negative for photophobia and visual disturbance.  Respiratory: Positive for cough and wheezing. Negative for chest tightness and shortness of breath.   Cardiovascular: Negative for chest pain, palpitations and leg swelling.  Gastrointestinal: Positive for  nausea and vomiting. Negative for abdominal pain and diarrhea.  Genitourinary: Negative for dysuria, urgency, flank pain, difficulty urinating and pelvic pain.  Musculoskeletal: Positive for neck pain. Negative for myalgias, arthralgias and neck stiffness.  Skin: Negative for rash.  Neurological: Negative for dizziness, weakness and headaches.  All other systems reviewed and are negative.     Allergies  Review of patient's allergies indicates no known allergies.  Home Medications   Prior to Admission medications   Medication Sig Start Date End Date Taking? Authorizing Provider  acetaminophen (TYLENOL) 500 MG tablet Take 500 mg by mouth every 6 (six) hours as needed for mild pain or headache.   Yes Historical Provider, MD  guaiFENesin-dextromethorphan (ROBITUSSIN DM) 100-10 MG/5ML syrup Take 15 mLs by mouth every 4 (four) hours as needed for cough.   Yes Historical Provider, MD  ibuprofen (ADVIL,MOTRIN) 200 MG tablet Take 800 mg by mouth every 6 (six) hours as needed for headache or cramping.   Yes Historical Provider, MD  cephALEXin (KEFLEX) 500 MG capsule Take 1 capsule (500 mg total) by mouth 2 (two) times daily. Patient not taking: Reported on 01/01/2015 04/11/14   Rodolph BongEvan S Corey, MD  cyclobenzaprine (FLEXERIL) 10 MG tablet Take 1 tablet (10 mg total) by mouth 3 (three) times daily as needed for muscle spasms (or pain). Patient not taking: Reported on 01/01/2015 04/05/14   Trixie DredgeEmily West, PA-C  fluconazole (DIFLUCAN) 150 MG tablet Take 1 tablet (150 mg total) by mouth once. Patient not taking: Reported on  01/01/2015 04/11/14   Rodolph Bong, MD  metroNIDAZOLE (FLAGYL) 500 MG tablet Take 1 tablet (500 mg total) by mouth 2 (two) times daily. Patient not taking: Reported on 01/01/2015 04/11/14   Rodolph Bong, MD   BP 144/75 mmHg  Pulse 82  Temp(Src) 97.8 F (36.6 C) (Oral)  Resp 20  SpO2 98%  LMP 12/24/2014 (Exact Date) Physical Exam  Constitutional: She is oriented to person, place, and time.  She appears well-developed and well-nourished. No distress.  HENT:  Head: Normocephalic.  Right Ear: Tympanic membrane, external ear and ear canal normal.  Left Ear: Tympanic membrane, external ear and ear canal normal.  Nose: Mucosal edema and rhinorrhea present.  Mouth/Throat: Uvula is midline and mucous membranes are normal. Posterior oropharyngeal erythema present. No oropharyngeal exudate, posterior oropharyngeal edema or tonsillar abscesses.  Eyes: Conjunctivae are normal.  Neck: Normal range of motion. Neck supple.  Cardiovascular: Normal rate, regular rhythm and normal heart sounds.   Pulmonary/Chest: Effort normal. No respiratory distress. She has wheezes. She has no rales.  Expiratory wheezes bilaterally  Abdominal: Soft. Bowel sounds are normal. She exhibits no distension. There is no tenderness. There is no rebound and no guarding.  Musculoskeletal: She exhibits no edema.  Neurological: She is alert and oriented to person, place, and time.  Skin: Skin is warm and dry.  Psychiatric: She has a normal mood and affect. Her behavior is normal.  Nursing note and vitals reviewed.   ED Course  Procedures (including critical care time) Labs Review Labs Reviewed  CBC WITH DIFFERENTIAL - Abnormal; Notable for the following:    MCV 76.8 (*)    MCH 24.2 (*)    Neutrophils Relative % 34 (*)    Lymphocytes Relative 53 (*)    Eosinophils Relative 6 (*)    All other components within normal limits  COMPREHENSIVE METABOLIC PANEL  LIPASE, BLOOD  URINALYSIS, ROUTINE W REFLEX MICROSCOPIC  POC URINE PREG, ED    Imaging Review Dg Chest 2 View  01/01/2015   CLINICAL DATA:  Pt c/o congestive, productive cough w/fever intermittently x 4 days, hx smoking, and htn  EXAM: CHEST  2 VIEW  COMPARISON:  None.  FINDINGS: The heart size and mediastinal contours are within normal limits. Both lungs are clear. No pleural effusion or pneumothorax. The visualized skeletal structures are unremarkable.   IMPRESSION: No active cardiopulmonary disease.   Electronically Signed   By: Amie Portland M.D.   On: 01/01/2015 19:38     EKG Interpretation None      MDM   Final diagnoses:  Cough  Viral URI with cough    Pt with URI symptoms. Labs already ordered by triage RN. Will add cxr, duoneb ordered for wheezing, prednisone ordered. tussionex for cough. Non toxic. No meningismus. Abdomen benign.   Patient's labs and x-ray unremarkable. She is feeling better after breathing treatment and cough syrup. Most likely bronchitis, viral. Will discharge home with prednisone pack, inhaler, Tessalon for cough. Patient is afebrile, no elevated white blood cell count, she is nontoxic appearing, oxygen saturation 90% on room air. Patient is agreeable to the plan. She will follow up with her primary care doctor.  Filed Vitals:   01/01/15 1736 01/01/15 2001  BP: 144/75 115/60  Pulse: 82 66  Temp: 97.8 F (36.6 C)   TempSrc: Oral   Resp: 20 22  SpO2: 98% 99%     Lottie Mussel, PA-C 01/02/15 1245  Candyce Churn III, MD 01/03/15 2318

## 2015-01-01 NOTE — ED Notes (Signed)
Bedside report received from previous RN, Carrie. 

## 2016-02-02 IMAGING — CR DG CHEST 2V
2 series · 2 of 2 positions shown · non-contrast
Comparison: None.

CLINICAL DATA: Pt c/o congestive, productive cough w/fever
intermittently x 4 days, hx smoking, and htn

EXAM:
CHEST  2 VIEW

[w chest pa]
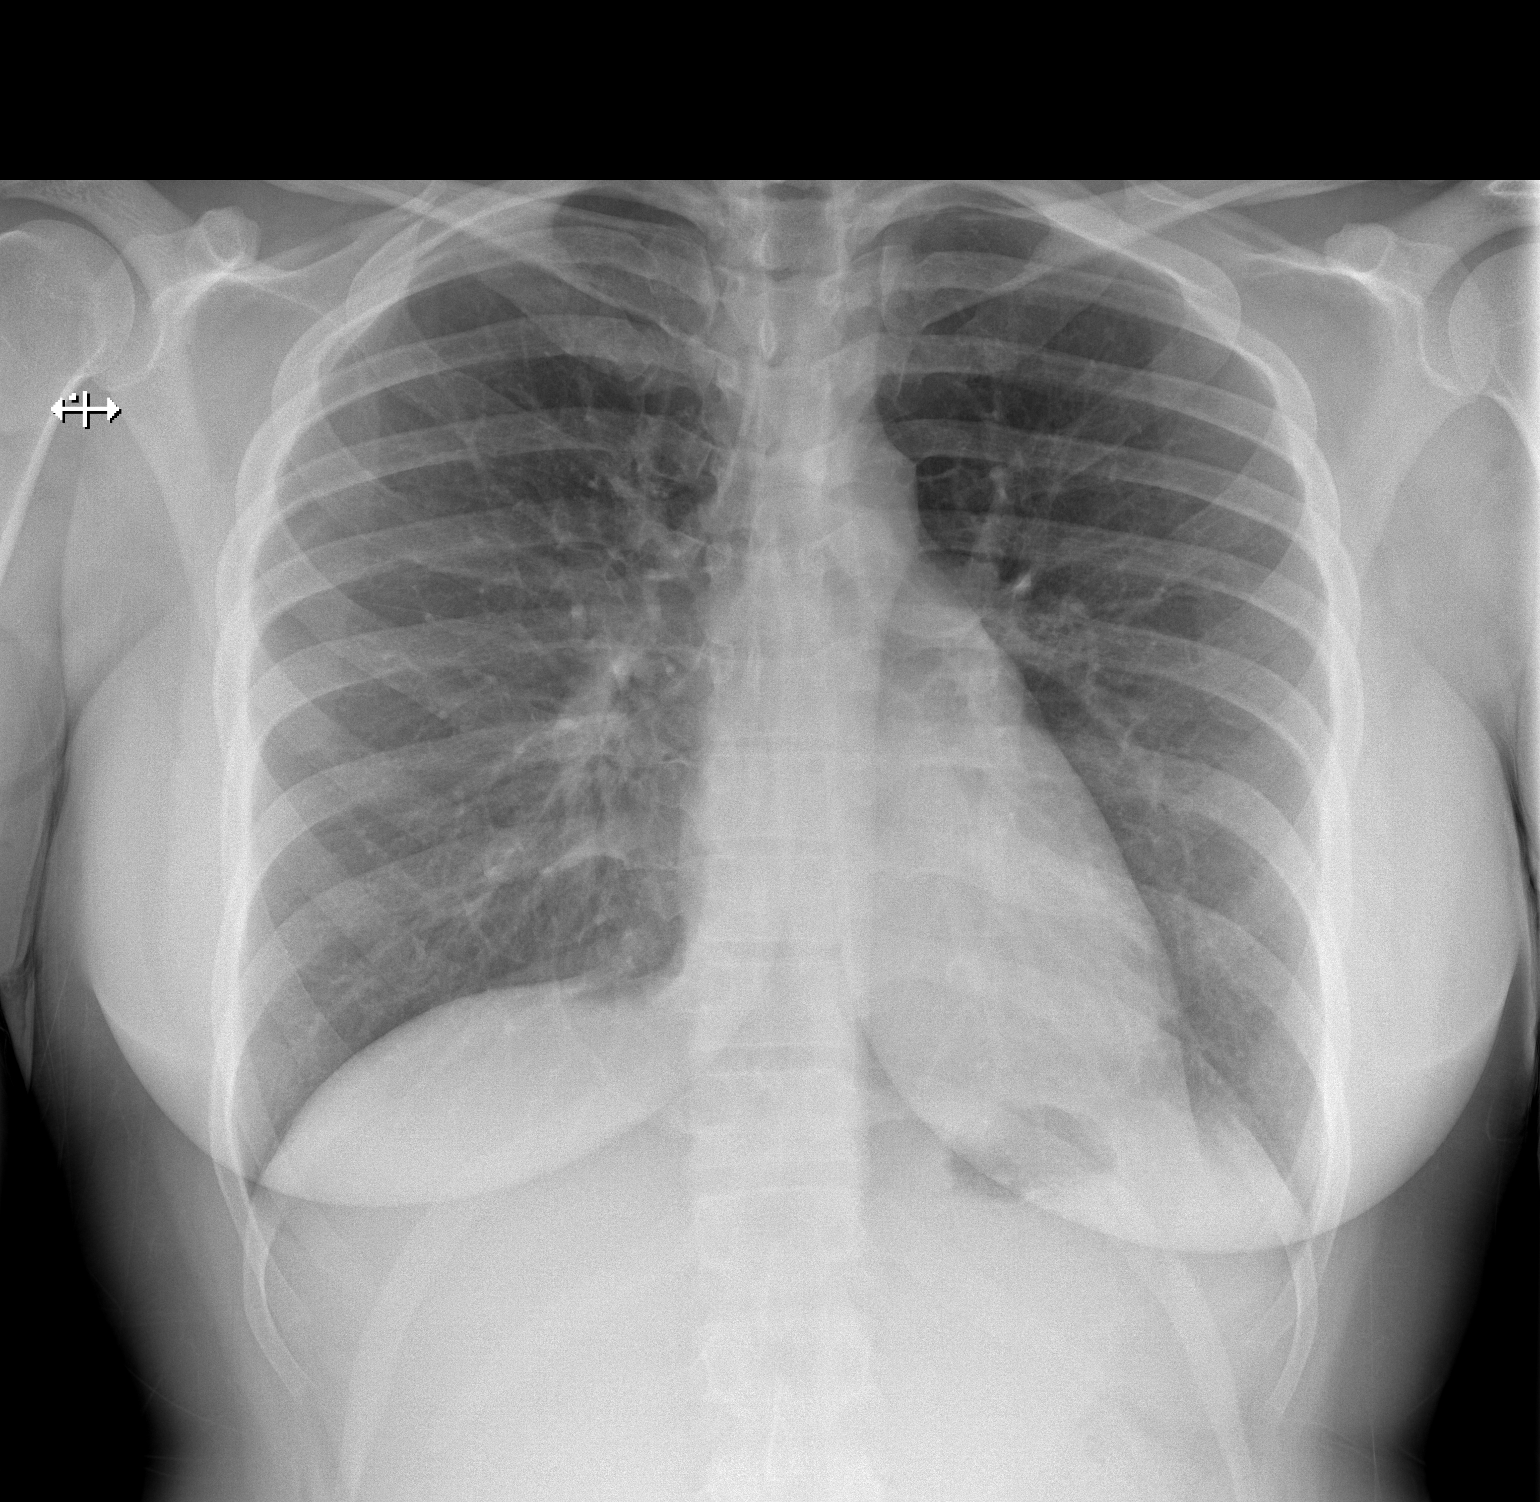

[w chest lat]
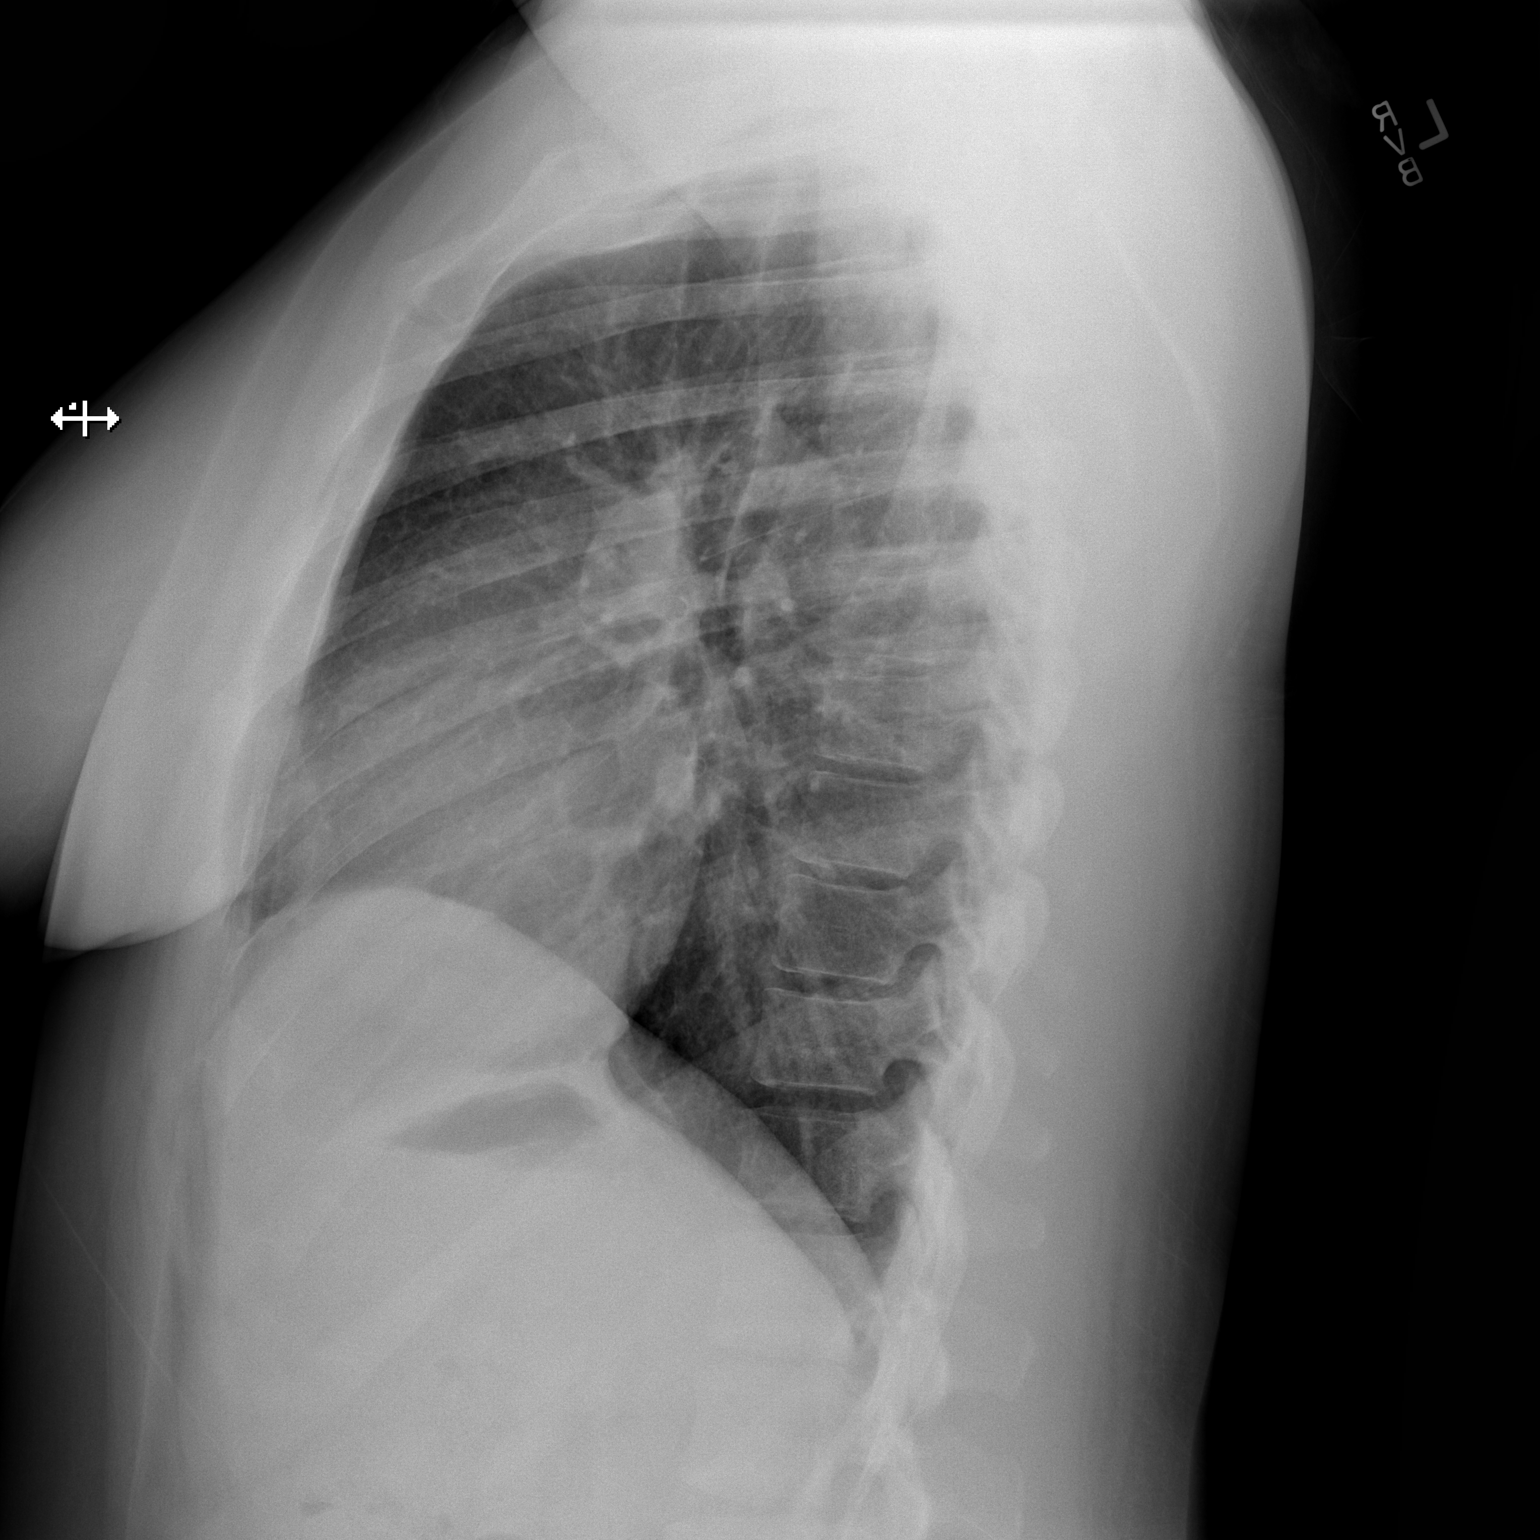

[2 of 2 positions shown; findings below may reference images not displayed]

FINDINGS: The heart size and mediastinal contours are within normal limits.
Both lungs are clear. No pleural effusion or pneumothorax. The
visualized skeletal structures are unremarkable.
IMPRESSION: No active cardiopulmonary disease.

## 2016-02-25 ENCOUNTER — Encounter (HOSPITAL_COMMUNITY): Payer: Self-pay | Admitting: Emergency Medicine

## 2016-02-25 ENCOUNTER — Emergency Department (HOSPITAL_COMMUNITY)
Admission: EM | Admit: 2016-02-25 | Discharge: 2016-02-25 | Disposition: A | Discharge status: Home / Self Care | Payer: Medicaid Other | Attending: Emergency Medicine | Admitting: Emergency Medicine

## 2016-02-25 DIAGNOSIS — Z79899 Other long term (current) drug therapy: Secondary | ICD-10-CM | POA: Diagnosis not present

## 2016-02-25 DIAGNOSIS — L0291 Cutaneous abscess, unspecified: Secondary | ICD-10-CM

## 2016-02-25 DIAGNOSIS — F17210 Nicotine dependence, cigarettes, uncomplicated: Secondary | ICD-10-CM | POA: Insufficient documentation

## 2016-02-25 DIAGNOSIS — N644 Mastodynia: Secondary | ICD-10-CM | POA: Diagnosis present

## 2016-02-25 DIAGNOSIS — N611 Abscess of the breast and nipple: Secondary | ICD-10-CM | POA: Diagnosis not present

## 2016-02-25 MED ORDER — HYDROCODONE-ACETAMINOPHEN 5-325 MG PO TABS
1.0000 | ORAL_TABLET | Freq: Once | ORAL | Status: AC
  Administered 2016-02-25: 1 via ORAL
  Filled 2016-02-25: qty 1

## 2016-02-25 MED ORDER — DOXYCYCLINE HYCLATE 100 MG PO CAPS: 100.0000 mg | ORAL_CAPSULE | Freq: Two times a day (BID) | ORAL | Status: DC

## 2016-02-25 MED ORDER — LIDOCAINE HCL 1 % IJ SOLN
INTRAMUSCULAR | Status: AC
  Administered 2016-02-25: 20 mL
  Filled 2016-02-25: qty 20

## 2016-02-25 MED ORDER — LIDOCAINE HCL (PF) 1 % IJ SOLN: 5.0000 mL | Freq: Once | INTRAMUSCULAR | Status: DC

## 2016-02-25 NOTE — Discharge Instructions (Signed)
Abscess °An abscess is an infected area that contains a collection of pus and debris. It can occur in almost any part of the body. An abscess is also known as a furuncle or boil. °CAUSES  °An abscess occurs when tissue gets infected. This can occur from blockage of oil or sweat glands, infection of hair follicles, or a minor injury to the skin. As the body tries to fight the infection, pus collects in the area and creates pressure under the skin. This pressure causes pain. People with weakened immune systems have difficulty fighting infections and get certain abscesses more often.  °SYMPTOMS °Usually an abscess develops on the skin and becomes a painful mass that is red, warm, and tender. If the abscess forms under the skin, you may feel a moveable soft area under the skin. Some abscesses break open (rupture) on their own, but most will continue to get worse without care. The infection can spread deeper into the body and eventually into the bloodstream, causing you to feel ill.  °DIAGNOSIS  °Your caregiver will take your medical history and perform a physical exam. A sample of fluid may also be taken from the abscess to determine what is causing your infection. °TREATMENT  °Your caregiver may prescribe antibiotic medicines to fight the infection. However, taking antibiotics alone usually does not cure an abscess. Your caregiver may need to make a small cut (incision) in the abscess to drain the pus. In some cases, gauze is packed into the abscess to reduce pain and to continue draining the area. °HOME CARE INSTRUCTIONS  °· Only take over-the-counter or prescription medicines for pain, discomfort, or fever as directed by your caregiver. °· If you were prescribed antibiotics, take them as directed. Finish them even if you start to feel better. °· If gauze is used, follow your caregiver's directions for changing the gauze. °· To avoid spreading the infection: °· Keep your draining abscess covered with a  bandage. °· Wash your hands well. °· Do not share personal care items, towels, or whirlpools with others. °· Avoid skin contact with others. °· Keep your skin and clothes clean around the abscess. °· Keep all follow-up appointments as directed by your caregiver. °SEEK MEDICAL CARE IF:  °· You have increased pain, swelling, redness, fluid drainage, or bleeding. °· You have muscle aches, chills, or a general ill feeling. °· You have a fever. °MAKE SURE YOU:  °· Understand these instructions. °· Will watch your condition. °· Will get help right away if you are not doing well or get worse. °  °This information is not intended to replace advice given to you by your health care provider. Make sure you discuss any questions you have with your health care provider. °  °Document Released: 09/16/2005 Document Revised: 06/07/2012 Document Reviewed: 02/19/2012 °Elsevier Interactive Patient Education ©2016 [employer] ° °Incision and Drainage °Incision and drainage is a procedure in which a sac-like structure (cystic structure) is opened and drained. The area to be drained usually contains material such as pus, fluid, or blood.  °LET YOUR CAREGIVER KNOW ABOUT:  °· Allergies to medicine. °· Medicines taken, including vitamins, herbs, eyedrops, over-the-counter medicines, and creams. °· Use of steroids (by mouth or creams). °· Previous problems with anesthetics or numbing medicines. °· History of bleeding problems or blood clots. °· Previous surgery. °· Other health problems, including diabetes and kidney problems. °· Possibility of pregnancy, if this applies. °RISKS AND COMPLICATIONS °· Pain. °· Bleeding. °· Scarring. °· Infection. °BEFORE THE PROCEDURE  °  You may need to have an ultrasound or other imaging tests to see how large or deep your cystic structure is. Blood tests may also be used to determine if you have an infection or how severe the infection is. You may need to have a tetanus shot. °PROCEDURE  °The affected area  is cleaned with a cleaning fluid. The cyst area will then be numbed with a medicine (local anesthetic). A small incision will be made in the cystic structure. A syringe or catheter may be used to drain the contents of the cystic structure, or the contents may be squeezed out. The area will then be flushed with a cleansing solution. After cleansing the area, it is often gently packed with a gauze or another wound dressing. Once it is packed, it will be covered with gauze and tape or some other type of wound dressing.  °AFTER THE PROCEDURE  °· Often, you will be allowed to go home right after the procedure. °· You may be given antibiotic medicine to prevent or heal an infection. °· If the area was packed with gauze or some other wound dressing, you will likely need to come back in 1 to 2 days to get it removed. °· The area should heal in about 14 days. °  °This information is not intended to replace advice given to you by your health care provider. Make sure you discuss any questions you have with your health care provider. °  °Document Released: 06/02/2001 Document Revised: 06/07/2012 Document Reviewed: 02/01/2012 °Elsevier Interactive Patient Education ©2016 [employer]vier [employer] ° °

## 2016-02-25 NOTE — ED Notes (Signed)
Pt c/o abscess to under rt breast since Sunday.  Denies any other symptoms.

## 2016-02-25 NOTE — ED Notes (Signed)
Bed: WA26 Expected date:  Expected time:  Means of arrival:  Comments: 

## 2016-02-25 NOTE — ED Provider Notes (Signed)
CSN: [id number]     Arrival date & time 02/25/16  0908 History   First MD Initiated Contact with Patient 02/25/16 1002     Chief Complaint  Patient presents with  . Abscess   HPI  Christy Meyer is a 35 year old female presenting with breast abscess. She is complaining of a painful knot to the right inferior breast. She noted this two days ago. She is complaining of warmth to the touch and spreading redness of the skin surrounding. She denies purulent drainage. She denies history of abscesses. She is not breast-feeding. She denies nipple discharge. She has had a mammogram "a long time ago" which was normal. Denies a lump in the area prior to Sunday. She denies systemic symptoms including fever, chills, dizziness, nausea or vomiting. No family history of breast cancer. She has no other complaints today.    Past Medical History  Diagnosis Date  . Seizures (HCC)     epilepsy  . PID (pelvic inflammatory disease)    Past Surgical History  Procedure Laterality Date  . L eye prothesis    . Tubal ligation     Family History  Problem Relation Age of Onset  . Diabetes Mother   . Hypertension Mother    Social History  Substance Use Topics  . Smoking status: Current Every Day Smoker -- 1.00 packs/day    Types: Cigarettes  . Smokeless tobacco: None  . Alcohol Use: Yes     Comment: occasionally   OB History    No data available     Review of Systems  Skin: Positive for color change.  All other systems reviewed and are negative.     Allergies  Review of patient's allergies indicates no known allergies.  Home Medications   Prior to Admission medications   Medication Sig Start Date End Date Taking? Authorizing Provider  acetaminophen (TYLENOL) 500 MG tablet Take 500 mg by mouth every 6 (six) hours as needed for mild pain or headache.    Historical Provider, MD  benzonatate (TESSALON) 100 MG capsule Take 1 capsule (100 mg total) by mouth every 8 (eight) hours. 01/01/15   Tatyana  Kirichenko, PA-C  cephALEXin (KEFLEX) 500 MG capsule Take 1 capsule (500 mg total) by mouth 2 (two) times daily. Patient not taking: Reported on 01/01/2015 04/11/14   Rodolph Bong, MD  cyclobenzaprine (FLEXERIL) 10 MG tablet Take 1 tablet (10 mg total) by mouth 3 (three) times daily as needed for muscle spasms (or pain). Patient not taking: Reported on 01/01/2015 04/05/14   Trixie Dredge, PA-C  doxycycline (VIBRAMYCIN) 100 MG capsule Take 1 capsule (100 mg total) by mouth 2 (two) times daily. 02/25/16   Candance Bohlman, PA-C  fluconazole (DIFLUCAN) 150 MG tablet Take 1 tablet (150 mg total) by mouth once. Patient not taking: Reported on 01/01/2015 04/11/14   Rodolph Bong, MD  guaiFENesin-dextromethorphan [facility] DM) 100-10 MG/5ML syrup Take 15 mLs by mouth every 4 (four) hours as needed for cough.    Historical Provider, MD  ibuprofen (ADVIL,MOTRIN) 200 MG tablet Take 800 mg by mouth every 6 (six) hours as needed for headache or cramping.    Historical Provider, MD  metroNIDAZOLE (FLAGYL) 500 MG tablet Take 1 tablet (500 mg total) by mouth 2 (two) times daily. Patient not taking: Reported on 01/01/2015 04/11/14   Rodolph Bong, MD  predniSONE (DELTASONE) 10 MG tablet Take 5 tab day 1, take 4 tab day 2, take 3 tab day 3, take 2 tab  day 4, and take 1 tab day 5 01/01/15   Tatyana Kirichenko, PA-C  pseudoephedrine (SUDAFED) 30 MG tablet Take 1 tablet (30 mg total) by mouth every 4 (four) hours as needed for congestion. 01/01/15   Tatyana Kirichenko, PA-C   BP 137/85 mmHg  Pulse 74  Temp(Src) 98.1 F (36.7 C) (Oral)  Resp 18  SpO2 100% Physical Exam  Constitutional: She appears well-developed and well-nourished. No distress.  Nontoxic appearing  HENT:  Head: Normocephalic and atraumatic.  Right Ear: External ear normal.  Left Ear: External ear normal.  Eyes: Conjunctivae are normal. Right eye exhibits no discharge. Left eye exhibits no discharge. No scleral icterus.  Neck: Normal range of motion.    Cardiovascular: Normal rate.   Pulmonary/Chest: Effort normal. Right breast exhibits skin change and tenderness. Right breast exhibits no inverted nipple and no nipple discharge.    Area of induration, warmth and erythema noted to right breast in the 6 o clock position. Small amount of fluctuance approximately 2 cm in diameter. No active drainage. No streaking of the skin. No inversion of the nipple or drainage noted.   Musculoskeletal: Normal range of motion.  Moves all extremities spontaneously  Neurological: She is alert. Coordination normal.  Skin: Skin is warm and dry.  Psychiatric: She has a normal mood and affect. Her behavior is normal.  Nursing note and vitals reviewed.   ED Course  .Marland Kitchen.Incision and Drainage Date/Time: 02/25/2016 10:56 AM Performed by: Alveta HeimlichBARRETT, Niala Stcharles Authorized by: Alveta HeimlichBARRETT, Manuelito Poage Consent: Verbal consent obtained. Risks and benefits: risks, benefits and alternatives were discussed Consent given by: patient Type: abscess Body area: trunk Location details: right breast Anesthesia: local infiltration Local anesthetic: lidocaine 1% without epinephrine Anesthetic total: 3 ml Patient sedated: no Scalpel size: 11 Needle gauge: 25. Incision type: single straight Complexity: simple Drainage: purulent Drainage amount: moderate Packing material: 1/4 in iodoform gauze Patient tolerance: Patient tolerated the procedure well with no immediate complications   (including critical care time) Labs Review Labs Reviewed - No data to display  Imaging Review No results found. I have personally reviewed and evaluated these images and lab results as part of my medical decision-making.   EKG Interpretation None      MDM   Final diagnoses:  Abscess   Patient presenting with skin abscess of the right breast amenable to incision and drainage.  Patient tolerated the procedure well. Iodoform gauze packing inserted. Area of erythema marked with skin marker. Will  discharge with doxycycline x 5 days. Discussed proper wound care. Instructed to go to PCP or urgent care in 2 days for wound recheck and packing removal. Patient expresses understanding and is stable for discharge.      Rolm GalaStevi Mizuki Hoel, PA-C 02/25/16 1107  Rolland PorterMark James, MD 03/02/16 [phone]

## 2017-05-12 LAB — GLUCOSE, POCT (MANUAL RESULT ENTRY): POC Glucose: 138 mg/dl — AB (ref 70–99)

## 2018-12-17 ENCOUNTER — Emergency Department (HOSPITAL_COMMUNITY)
Admission: EM | Admit: 2018-12-17 | Discharge: 2018-12-17 | Disposition: A | Discharge status: Home / Self Care | Payer: Self-pay | Attending: Emergency Medicine | Admitting: Emergency Medicine

## 2018-12-17 DIAGNOSIS — K0889 Other specified disorders of teeth and supporting structures: Secondary | ICD-10-CM | POA: Insufficient documentation

## 2018-12-17 DIAGNOSIS — F17210 Nicotine dependence, cigarettes, uncomplicated: Secondary | ICD-10-CM | POA: Insufficient documentation

## 2018-12-17 MED ORDER — BUPIVACAINE-EPINEPHRINE (PF) 0.5% -1:200000 IJ SOLN
1.8000 mL | Freq: Once | INTRAMUSCULAR | Status: AC
  Administered 2018-12-17: 1.8 mL
  Filled 2018-12-17: qty 1.8

## 2018-12-17 MED ORDER — OXYCODONE-ACETAMINOPHEN 5-325 MG PO TABS
1.0000 | ORAL_TABLET | Freq: Once | ORAL | Status: AC
  Administered 2018-12-17: 1 via ORAL
  Filled 2018-12-17: qty 1

## 2018-12-17 MED ORDER — PENICILLIN V POTASSIUM 125 MG/5ML PO SOLR: 125.0000 mg | Freq: Once | ORAL | Status: DC

## 2018-12-17 MED ORDER — PENICILLIN V POTASSIUM 500 MG PO TABS: 1000.0000 mg | ORAL_TABLET | Freq: Two times a day (BID) | ORAL | 0 refills | Status: AC

## 2018-12-17 MED ORDER — PENICILLIN V POTASSIUM 250 MG PO TABS
500.0000 mg | ORAL_TABLET | Freq: Once | ORAL | Status: AC
  Administered 2018-12-17: 500 mg via ORAL
  Filled 2018-12-17: qty 2

## 2018-12-17 MED ORDER — IBUPROFEN 800 MG PO TABS: 800.0000 mg | ORAL_TABLET | Freq: Three times a day (TID) | ORAL | 0 refills | Status: AC

## 2018-12-17 NOTE — Progress Notes (Signed)
ED CM noted patient to have had 5 ED visits in the past 6 months. CM met with patient in triage to discuss assistance with access to care. Patient reports not having a PCP or health insurance. CM discussed the benefits of a Medical Home patient verbalized understanding, CM provided patient with resources for [facility] community clinic ([facility]) with instruction to contact office on Monday 12/30 after 8:30 am . Patient is appreciative of the resources. No further ED CM needs identified

## 2018-12-17 NOTE — ED Provider Notes (Signed)
Emergency Department Provider Note   I have reviewed the triage vital signs and the nursing notes.   HISTORY  Chief Complaint Dental Pain   HPI Christy Meyer is a 37 y.o. female with a history of dental caries and cavities presents the emergency department today with left lower jaw pain for the last couple days progressively worsening with associated swelling and cavities.  No fevers.  No trouble swallowing or breathing.  Similar to previous episodes.  Knows that she needs to see a dentist but has not seen 1 and the pain got unbearable today so she presents here for further evaluation.  He has taken multiple doses of Advil. No other associated or modifying symptoms.    Past Medical History:  Diagnosis Date  . PID (pelvic inflammatory disease)   . Seizures (HCC)    epilepsy    There are no active problems to display for this patient.   Past Surgical History:  Procedure Laterality Date  . L eye prothesis    . TUBAL LIGATION      Current Outpatient Rx  . Order #: [id number] Class: Historical Med  . Order #: [id number] Class: Print  . Order #: [id number] Class: Print  . Order #: [id number] Class: Historical Med  . Order #: [id number] Class: Print  . Order #: [id number] Class: Print  . Order #: [id number] Class: Print  . Order #: [id number] Class: Print    Allergies Patient has no known allergies.  Family History  Problem Relation Age of Onset  . Diabetes Mother   . Hypertension Mother     Social History Social History   Tobacco Use  . Smoking status: Current Every Day Smoker    Packs/day: 1.00    Types: Cigarettes  Substance Use Topics  . Alcohol use: Yes    Comment: occasionally  . Drug use: No    Review of Systems  All other systems negative except as documented in the HPI. All pertinent positives and negatives as reviewed in the HPI. ____________________________________________   PHYSICAL EXAM:  VITAL SIGNS: ED Triage Vitals [12/17/18 0847]  Enc Vitals  Group     BP (!) 168/96     Pulse Rate 78     Resp 16     Temp 98 F (36.7 C)     Temp Source Oral     SpO2 98 %    Constitutional: Alert and oriented. Well appearing and in no acute distress. Eyes: Conjunctivae are normal. PERRL. EOMI. Head: Atraumatic. Nose: No congestion/rhinnorhea. Mouth/Throat: Mucous membranes are moist.  Oropharynx non-erythematous.  Multiple dental caries.  Has 2 in the left jaw that are almost eroded away with significant damage.  No obvious abscess.  No sublingual induration.  No airway involvement. Neck: No stridor.  No meningeal signs.   Cardiovascular: Normal rate, regular rhythm. Good peripheral circulation. Grossly normal heart sounds.   Respiratory: Normal respiratory effort.  No retractions. Lungs CTAB. Gastrointestinal: Soft and nontender. No distention.  Musculoskeletal: No lower extremity tenderness nor edema. No gross deformities of extremities. Neurologic:  Normal speech and language. No gross focal neurologic deficits are appreciated.  Skin:  Skin is warm, dry and intact. No rash noted.  ____________________________________________   PROCEDURES  Procedure(s) performed:   Dental Block Date/Time: 12/17/2018 10:04 AM Performed by: Marily MemosMesner, Bernette Seeman, MD Authorized by: Marily MemosMesner, Kadence Mikkelson, MD   Consent:    Consent obtained:  Verbal   Consent given by:  Patient   Risks discussed:  Infection, nerve damage, swelling and unsuccessful block   Alternatives discussed:  No treatment Indications:    Indications: dental pain   Location:    Block type:  Supraperiosteal   Supraperiosteal location:  Lower teeth   Lower teeth location:  23/LL lateral incisor Procedure details (see MAR for exact dosages):    Topical anesthetic:  Tetracaine gel   Syringe type:  Aspirating dental syringe   Needle gauge:  25 G   Anesthetic injected:  Bupivacaine 0.5% WITH epi   Injection procedure:  Anatomic landmarks identified, introduced needle, incremental injection and  negative aspiration for blood Post-procedure details:    Outcome:  Anesthesia achieved   Patient tolerance of procedure:  Tolerated well, no immediate complications   ____________________________________________   INITIAL IMPRESSION / ASSESSMENT AND PLAN / ED COURSE  Dental block without complication.  And relieved with medication.  We will start antibiotics and follow-up with PCP.  Advised her not taking quite so much Advil and provided her with a prescription for appropriate dose of anti-inflammatory.     Pertinent labs & imaging results that were available during my care of the patient were reviewed by me and considered in my medical decision making (see chart for details).  ____________________________________________  FINAL CLINICAL IMPRESSION(S) / ED DIAGNOSES  Final diagnoses:  Pain, dental     MEDICATIONS GIVEN DURING THIS VISIT:  Medications  oxyCODONE-acetaminophen (PERCOCET/ROXICET) 5-325 MG per tablet 1 tablet (1 tablet Oral Given 12/17/18 0946)  bupivacaine-epinephrine (MARCAINE W/ EPI) 0.5% -1:200000 injection 1.8 mL (1.8 mLs Infiltration Given 12/17/18 0946)  penicillin v potassium (VEETID) tablet 500 mg (500 mg Oral Given 12/17/18 0946)     NEW OUTPATIENT MEDICATIONS STARTED DURING THIS VISIT:  Discharge Medication List as of 12/17/2018  9:36 AM    START taking these medications   Details  penicillin v potassium (VEETID) 500 MG tablet Take 2 tablets (1,000 mg total) by mouth 2 (two) times daily. X 7 days, Starting Sat 12/17/2018, Print        Note:  This note was prepared with assistance of Dragon voice recognition software. Occasional wrong-word or sound-a-like substitutions may have occurred due to the inherent limitations of voice recognition software.   Marily MemosMesner, Sanjith Siwek, MD 12/17/18 [phone]

## 2018-12-17 NOTE — ED Triage Notes (Signed)
Pt endorses left sided dental pain x 2 days. Facial swelling noted to left side. Airway intact. Pt states that she has taken 6000mg  of advil since yesterday with minimal relief. VSS.

## 2019-01-18 ENCOUNTER — Encounter (HOSPITAL_COMMUNITY): Payer: Self-pay | Admitting: *Deleted

## 2019-01-18 ENCOUNTER — Emergency Department (HOSPITAL_COMMUNITY)
Admission: EM | Admit: 2019-01-18 | Discharge: 2019-01-18 | Disposition: A | Discharge status: Home / Self Care | Payer: Self-pay | Attending: Emergency Medicine | Admitting: Emergency Medicine

## 2019-01-18 ENCOUNTER — Emergency Department (HOSPITAL_COMMUNITY): Payer: Self-pay

## 2019-01-18 DIAGNOSIS — Z79899 Other long term (current) drug therapy: Secondary | ICD-10-CM | POA: Insufficient documentation

## 2019-01-18 DIAGNOSIS — N730 Acute parametritis and pelvic cellulitis: Secondary | ICD-10-CM | POA: Insufficient documentation

## 2019-01-18 DIAGNOSIS — F17210 Nicotine dependence, cigarettes, uncomplicated: Secondary | ICD-10-CM | POA: Insufficient documentation

## 2019-01-18 LAB — CBC
HCT: 38.6 % (ref 36.0–46.0)
HEMOGLOBIN: 11.6 g/dL — AB (ref 12.0–15.0)
MCH: 23.4 pg — ABNORMAL LOW (ref 26.0–34.0)
MCHC: 30.1 g/dL (ref 30.0–36.0)
MCV: 77.8 fL — ABNORMAL LOW (ref 80.0–100.0)
NRBC: 0 % (ref 0.0–0.2)
Platelets: 292 10*3/uL (ref 150–400)
RBC: 4.96 MIL/uL (ref 3.87–5.11)
RDW: 15 % (ref 11.5–15.5)
WBC: 7.2 10*3/uL (ref 4.0–10.5)

## 2019-01-18 LAB — COMPREHENSIVE METABOLIC PANEL
ALT: 19 U/L (ref 0–44)
ANION GAP: 9 (ref 5–15)
AST: 20 U/L (ref 15–41)
Albumin: 3.4 g/dL — ABNORMAL LOW (ref 3.5–5.0)
Alkaline Phosphatase: 62 U/L (ref 38–126)
BUN: 8 mg/dL (ref 6–20)
CHLORIDE: 106 mmol/L (ref 98–111)
CO2: 22 mmol/L (ref 22–32)
CREATININE: 0.94 mg/dL (ref 0.44–1.00)
Calcium: 8.8 mg/dL — ABNORMAL LOW (ref 8.9–10.3)
GFR calc Af Amer: >60 mL/min (ref >60)
Glucose, Bld: 101 mg/dL — ABNORMAL HIGH (ref 70–99)
Potassium: 4 mmol/L (ref 3.5–5.1)
SODIUM: 137 mmol/L (ref 135–145)
Total Bilirubin: 0.5 mg/dL (ref 0.3–1.2)
Total Protein: 6.3 g/dL — ABNORMAL LOW (ref 6.5–8.1)

## 2019-01-18 LAB — WET PREP, GENITAL
Clue Cells Wet Prep HPF POC: NONE SEEN
Sperm: NONE SEEN
Trich, Wet Prep: NONE SEEN
Yeast Wet Prep HPF POC: NONE SEEN

## 2019-01-18 LAB — I-STAT BETA HCG BLOOD, ED (MC, WL, AP ONLY): I-stat hCG, quantitative: <5 m[IU]/mL (ref <5)

## 2019-01-18 LAB — LIPASE, BLOOD: LIPASE: 26 U/L (ref 11–51)

## 2019-01-18 MED ORDER — FLUCONAZOLE 150 MG PO TABS: 150.0000 mg | ORAL_TABLET | Freq: Every day | ORAL | 0 refills | Status: AC

## 2019-01-18 MED ORDER — DOXYCYCLINE HYCLATE 100 MG PO CAPS: 100.0000 mg | ORAL_CAPSULE | Freq: Two times a day (BID) | ORAL | 0 refills | Status: AC

## 2019-01-18 MED ORDER — MORPHINE SULFATE (PF) 4 MG/ML IV SOLN
4.0000 mg | Freq: Once | INTRAVENOUS | Status: AC
  Administered 2019-01-18: 4 mg via INTRAVENOUS
  Filled 2019-01-18: qty 1

## 2019-01-18 MED ORDER — OXYCODONE-ACETAMINOPHEN 5-325 MG PO TABS: 1.0000 | ORAL_TABLET | Freq: Four times a day (QID) | ORAL | 0 refills | Status: AC | PRN

## 2019-01-18 MED ORDER — SODIUM CHLORIDE 0.9% FLUSH
3.0000 mL | Freq: Once | INTRAVENOUS | Status: AC
  Administered 2019-01-18: 3 mL via INTRAVENOUS

## 2019-01-18 MED ORDER — CEFTRIAXONE SODIUM 250 MG IJ SOLR
250.0000 mg | Freq: Once | INTRAMUSCULAR | Status: AC
  Administered 2019-01-18: 250 mg via INTRAMUSCULAR
  Filled 2019-01-18: qty 250

## 2019-01-18 MED ORDER — LIDOCAINE HCL (PF) 1 % IJ SOLN
INTRAMUSCULAR | Status: AC
  Filled 2019-01-18: qty 5

## 2019-01-18 MED FILL — OXYCODONE-ACETAMINOPHEN 5-3: 5-325 | 2 days supply | Qty: 6 | Fill #0

## 2019-01-18 NOTE — ED Provider Notes (Signed)
[facility] EMERGENCY DEPARTMENT Provider Note   CSN: [id number] Arrival date & time: 01/18/19  0736   History   Chief Complaint Chief Complaint  Patient presents with  . Abdominal Pain    HPI Christy Meyer is a 38 y.o. female.  HPI   38 year old female presents today with complaints of pelvic pain.  Patient notes that she was on her menstrual cycle last week which ended on Sunday (3 days ago).  She notes that while she was on her cycle she was having her typical menstrual cramps, bilateral crampy sensation with a small amount of diarrhea which she notes is normal for her.  She notes that once her bleeding stopped she developed worsening pain in the left pelvic region similar in nature but more severe than menstrual cramps.  She notes this is unusual for her.  She notes some small nausea denies any vomiting, denies any fever, she reports no upper abdominal pain.  She reports she is sexually active with female partner, she notes a small amount of discharge and spotting presently.  She notes a history of pelvic inflammatory disease, but notes that when she had that it was bilateral.  Past Medical History:  Diagnosis Date  . PID (pelvic inflammatory disease)   . Seizures (HCC)    epilepsy    There are no active problems to display for this patient.   Past Surgical History:  Procedure Laterality Date  . L eye prothesis    . TUBAL LIGATION       OB History   No obstetric history on file.      Home Medications    Prior to Admission medications   Medication Sig Start Date End Date Taking? Authorizing Provider  acetaminophen (TYLENOL) 500 MG tablet Take 500 mg by mouth every 6 (six) hours as needed for mild pain or headache.   Yes [provider]  ibuprofen (ADVIL,MOTRIN) 200 MG tablet Take 1,000 mg by mouth every 6 (six) hours as needed for moderate pain.   Yes [provider]  benzonatate (TESSALON) 100 MG capsule Take 1 capsule (100 mg  total) by mouth every 8 (eight) hours. Patient not taking: Reported on 01/18/2019 01/01/15   Jaynie Crumble, PA-C  doxycycline (VIBRAMYCIN) 100 MG capsule Take 1 capsule (100 mg total) by mouth 2 (two) times daily. 01/18/19   Jorita Bohanon, Tinnie Gens, PA-C  fluconazole (DIFLUCAN) 150 MG tablet Take 1 tablet (150 mg total) by mouth daily for 1 day. 01/18/19 01/19/19  Altha Sweitzer, Tinnie Gens, PA-C  ibuprofen (ADVIL,MOTRIN) 800 MG tablet Take 1 tablet (800 mg total) by mouth 3 (three) times daily. Patient not taking: Reported on 01/18/2019 12/17/18   Mesner, Barbara Cower, MD  oxyCODONE-acetaminophen (PERCOCET/ROXICET) 5-325 MG tablet Take 1 tablet by mouth every 6 (six) hours as needed for severe pain. 01/18/19   Denver Bentson, Tinnie Gens, PA-C  penicillin v potassium (VEETID) 500 MG tablet Take 2 tablets (1,000 mg total) by mouth 2 (two) times daily. X 7 days Patient not taking: Reported on 01/18/2019 12/17/18   Mesner, Barbara Cower, MD  predniSONE (DELTASONE) 10 MG tablet Take 5 tab day 1, take 4 tab day 2, take 3 tab day 3, take 2 tab day 4, and take 1 tab day 5 Patient not taking: Reported on 01/18/2019 01/01/15   Jaynie Crumble, PA-C  pseudoephedrine (SUDAFED) 30 MG tablet Take 1 tablet (30 mg total) by mouth every 4 (four) hours as needed for congestion. Patient not taking: Reported on 01/18/2019 01/01/15   Jaynie Crumble, PA-C  Family History Family History  Problem Relation Age of Onset  . Diabetes Mother   . Hypertension Mother     Social History Social History   Tobacco Use  . Smoking status: Current Every Day Smoker    Packs/day: 1.00    Types: Cigarettes  Substance Use Topics  . Alcohol use: Yes    Comment: occasionally  . Drug use: No     Allergies   Patient has no known allergies.   Review of Systems Review of Systems  All other systems reviewed and are negative.    Physical Exam Updated Vital Signs BP 127/62 (BP Location: Left Arm)   Pulse (!) 59   Temp 97.8 F (36.6 C) (Oral)   Resp 18    SpO2 100%   Physical Exam Vitals signs and nursing note reviewed.  Constitutional:      Appearance: She is well-developed.  HENT:     Head: Normocephalic and atraumatic.  Eyes:     General: No scleral icterus.       Right eye: No discharge.        Left eye: No discharge.     Conjunctiva/sclera: Conjunctivae normal.     Pupils: Pupils are equal, round, and reactive to light.  Neck:     Musculoskeletal: Normal range of motion.     Vascular: No JVD.     Trachea: No tracheal deviation.  Pulmonary:     Effort: Pulmonary effort is normal.     Breath sounds: No stridor.  Abdominal:     Comments: Tenderness palpation of the left lower pelvis, remainder of abdomen and pelvis nontender to palpation  Genitourinary:    Comments: Purulent discharge noted in the vaginal vault, no masses noted positive cervical motion tenderness Neurological:     Mental Status: She is alert and oriented to person, place, and time.     Coordination: Coordination normal.  Psychiatric:        Behavior: Behavior normal.        Thought Content: Thought content normal.        Judgment: Judgment normal.      ED Treatments / Results  Labs (all labs ordered are listed, but only abnormal results are displayed) Labs Reviewed  WET PREP, GENITAL - Abnormal; Notable for the following components:      Result Value   WBC, Wet Prep HPF POC MANY (*)    All other components within normal limits  COMPREHENSIVE METABOLIC PANEL - Abnormal; Notable for the following components:   Glucose, Bld 101 (*)    Calcium 8.8 (*)    Total Protein 6.3 (*)    Albumin 3.4 (*)    All other components within normal limits  CBC - Abnormal; Notable for the following components:   Hemoglobin 11.6 (*)    MCV 77.8 (*)    MCH 23.4 (*)    All other components within normal limits  LIPASE, BLOOD  URINALYSIS, ROUTINE W REFLEX MICROSCOPIC  I-STAT BETA HCG BLOOD, ED ([facility], [facility], [facility] ONLY)  GC/CHLAMYDIA PROBE AMP () NOT AT [facility]     EKG None  Radiology [place] Transvaginal Non-ob  Result Date: 01/18/2019 CLINICAL DATA:  Left lower quadrant pain for several days EXAM: TRANSABDOMINAL AND TRANSVAGINAL ULTRASOUND OF PELVIS DOPPLER ULTRASOUND OF OVARIES TECHNIQUE: Both transabdominal and transvaginal ultrasound examinations of the pelvis were performed. Transabdominal technique was performed for global imaging of the pelvis including uterus, ovaries, adnexal regions, and pelvic cul-de-sac. It was necessary to proceed with endovaginal  exam following the transabdominal exam to visualize the ovaries. Color and duplex Doppler ultrasound was utilized to evaluate blood flow to the ovaries. COMPARISON:  None. FINDINGS: Uterus Measurements: 12.9 x 8.5 x 11.0 cm. = volume: 624 mL. Multiple uterine fibroids are noted. The largest of these lies to the left anteriorly and measures 7.5 cm. Endometrium Thickness: 10.2 mm.  No focal abnormality visualized. Right ovary Measurements: 2.6 x 1.6 x 2.4 cm. = volume: 5.4 mL. Normal appearance/no adnexal mass. Left ovary Measurements: 2.4 x 1.9 x 2.2 cm. = volume: 5.1 mL. Normal appearance/no adnexal mass. Pulsed Doppler evaluation of both ovaries demonstrates normal low-resistance arterial and venous waveforms. Other findings No abnormal free fluid. IMPRESSION: Multiple uterine fibroids without complicating factors. Electronically Signed   By: Alcide Clever M.D.   On: 01/18/2019 10:33   US Pelvis Complete  Result Date: 01/18/2019 CLINICAL DATA:  Left lower quadrant pain for several days EXAM: TRANSABDOMINAL AND TRANSVAGINAL ULTRASOUND OF PELVIS DOPPLER ULTRASOUND OF OVARIES TECHNIQUE: Both transabdominal and transvaginal ultrasound examinations of the pelvis were performed. Transabdominal technique was performed for global imaging of the pelvis including uterus, ovaries, adnexal regions, and pelvic cul-de-sac. It was necessary to proceed with endovaginal exam following the transabdominal exam to visualize the  ovaries. Color and duplex Doppler ultrasound was utilized to evaluate blood flow to the ovaries. COMPARISON:  None. FINDINGS: Uterus Measurements: 12.9 x 8.5 x 11.0 cm. = volume: 624 mL. Multiple uterine fibroids are noted. The largest of these lies to the left anteriorly and measures 7.5 cm. Endometrium Thickness: 10.2 mm.  No focal abnormality visualized. Right ovary Measurements: 2.6 x 1.6 x 2.4 cm. = volume: 5.4 mL. Normal appearance/no adnexal mass. Left ovary Measurements: 2.4 x 1.9 x 2.2 cm. = volume: 5.1 mL. Normal appearance/no adnexal mass. Pulsed Doppler evaluation of both ovaries demonstrates normal low-resistance arterial and venous waveforms. Other findings No abnormal free fluid. IMPRESSION: Multiple uterine fibroids without complicating factors. Electronically Signed   By: Alcide Clever M.D.   On: 01/18/2019 10:33   [place] Art/ven Flow Abd Pelv Doppler  Result Date: 01/18/2019 CLINICAL DATA:  Left lower quadrant pain for several days EXAM: TRANSABDOMINAL AND TRANSVAGINAL ULTRASOUND OF PELVIS DOPPLER ULTRASOUND OF OVARIES TECHNIQUE: Both transabdominal and transvaginal ultrasound examinations of the pelvis were performed. Transabdominal technique was performed for global imaging of the pelvis including uterus, ovaries, adnexal regions, and pelvic cul-de-sac. It was necessary to proceed with endovaginal exam following the transabdominal exam to visualize the ovaries. Color and duplex Doppler ultrasound was utilized to evaluate blood flow to the ovaries. COMPARISON:  None. FINDINGS: Uterus Measurements: 12.9 x 8.5 x 11.0 cm. = volume: 624 mL. Multiple uterine fibroids are noted. The largest of these lies to the left anteriorly and measures 7.5 cm. Endometrium Thickness: 10.2 mm.  No focal abnormality visualized. Right ovary Measurements: 2.6 x 1.6 x 2.4 cm. = volume: 5.4 mL. Normal appearance/no adnexal mass. Left ovary Measurements: 2.4 x 1.9 x 2.2 cm. = volume: 5.1 mL. Normal appearance/no adnexal  mass. Pulsed Doppler evaluation of both ovaries demonstrates normal low-resistance arterial and venous waveforms. Other findings No abnormal free fluid. IMPRESSION: Multiple uterine fibroids without complicating factors. Electronically Signed   By: Alcide Clever M.D.   On: 01/18/2019 10:33    Procedures Procedures (including critical care time)  Medications Ordered in ED Medications  lidocaine (PF) (XYLOCAINE) 1 % injection (has no administration in time range)  sodium chloride flush (NS) 0.9 % injection 3 mL (3 mLs Intravenous  Given 01/18/19 0929)  morphine 4 MG/ML injection 4 mg (4 mg Intravenous Given 01/18/19 0929)  cefTRIAXone (ROCEPHIN) injection 250 mg (250 mg Intramuscular Given 01/18/19 1122)     Initial Impression / Assessment and Plan / ED Course  I have reviewed the triage vital signs and the nursing notes.  Pertinent labs & imaging results that were available during my care of the patient were reviewed by me and considered in my medical decision making (see chart for details).     Labs:   Imaging:  Consults:  Therapeutics: Ceftriaxone  Discharge Meds: Doxycycline, Percocet  Assessment/Plan: Patient presentation is most consistent with pelvic inflammatory disease.  She is afebrile with no systemic symptoms presently.  She is tolerating p.o., I do feel she is a candidate for outpatient management.  Patient will be treated with ceftriaxone here, doxycycline for 14-day course.  She will follow-up as an outpatient with OB/GYN for repeat evaluation if symptoms persist and return immediately if they worsen.  She verbalized understanding and agreement to today's plan.    Final Clinical Impressions(s) / ED Diagnoses   Final diagnoses:  PID (acute pelvic inflammatory disease)    ED Discharge Orders         Ordered    doxycycline (VIBRAMYCIN) 100 MG capsule  2 times daily     01/18/19 1106    oxyCODONE-acetaminophen (PERCOCET/ROXICET) 5-325 MG tablet  Every 6 hours PRN       01/18/19 1108    fluconazole (DIFLUCAN) 150 MG tablet  Daily     01/18/19 1120           Eyvonne Mechanic, PA-C 01/18/19 1157    Wynetta Fines, MD 01/19/19 [phone]

## 2019-01-18 NOTE — Discharge Instructions (Addendum)
Please read attached information. If you experience any new or worsening signs or symptoms please return to the emergency room for evaluation. Please follow-up with your primary care provider or specialist as discussed. Please use medication prescribed only as directed and discontinue taking if you have any concerning signs or symptoms.   °

## 2019-01-18 NOTE — ED Triage Notes (Signed)
Pt in c/o left suprapubic pain, she has been cramping intermittently since Sunday when she was on her menstrual cycle, over the last few days the cramping has localized to her LLQ, worse with movement, has been taking ibuprofen and tylenol without relief, reports nausea and diarrhea since last night

## 2019-01-18 NOTE — ED Notes (Signed)
Patient given discharge instructions and verbalized understanding.  Patient stable to discharge at this time.  Patient is alert and oriented to baseline.  No distressed noted at this time.  All belongings taken with the patient at discharge.   

## 2019-01-19 LAB — GC/CHLAMYDIA PROBE AMP (~~LOC~~) NOT AT ARMC
Chlamydia: NEGATIVE
Neisseria Gonorrhea: POSITIVE — AB

## 2020-02-19 IMAGING — US US TRANSVAGINAL NON-OB
1 series · 13 of 25 positions shown · non-contrast
Comparison: None.

CLINICAL DATA: Left lower quadrant pain for several days

EXAM:
TRANSABDOMINAL AND TRANSVAGINAL ULTRASOUND OF PELVIS
DOPPLER ULTRASOUND OF OVARIES
TECHNIQUE: Both transabdominal and transvaginal ultrasound examinations of the
pelvis were performed. Transabdominal technique was performed for
global imaging of the pelvis including uterus, ovaries, adnexal
regions, and pelvic cul-de-sac.
It was necessary to proceed with endovaginal exam following the
transabdominal exam to visualize the ovaries. Color and duplex
Doppler ultrasound was utilized to evaluate blood flow to the
ovaries.

[Series 1: us transvaginal non-ob · 13 of 110 slices shown]
[im 1/110]
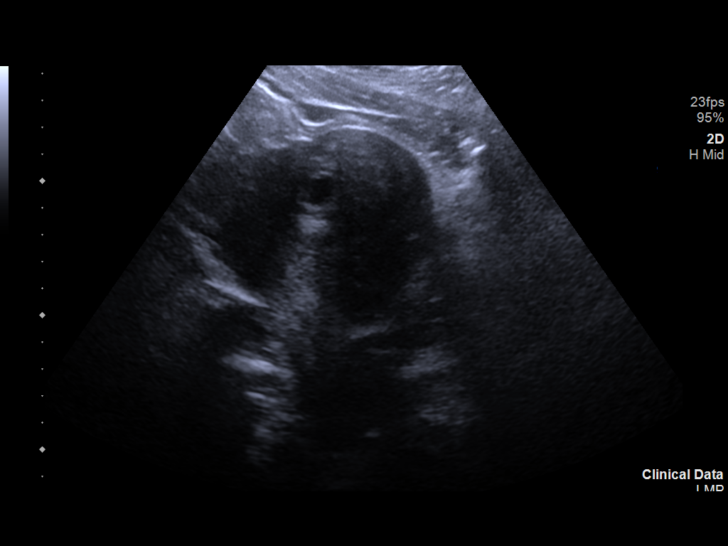
[im 10/110]
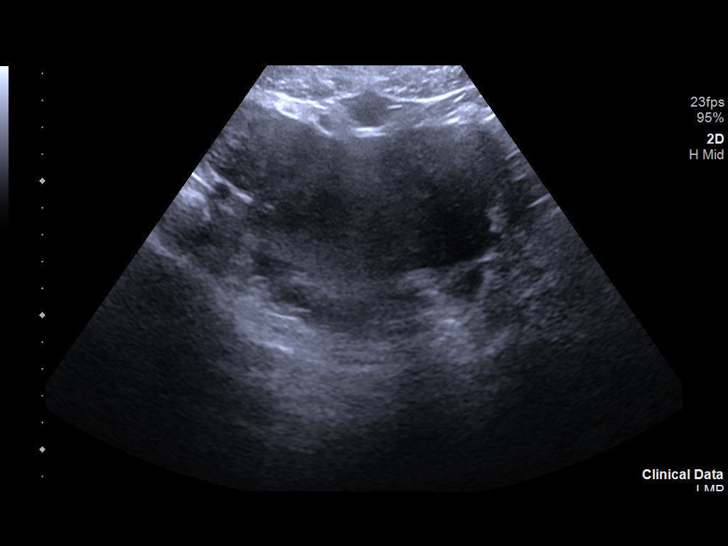
[im 19/110]
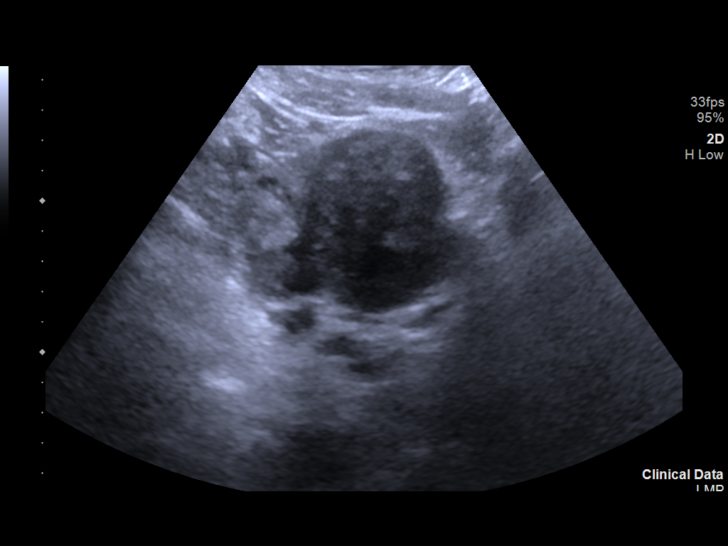
[im 28/110]
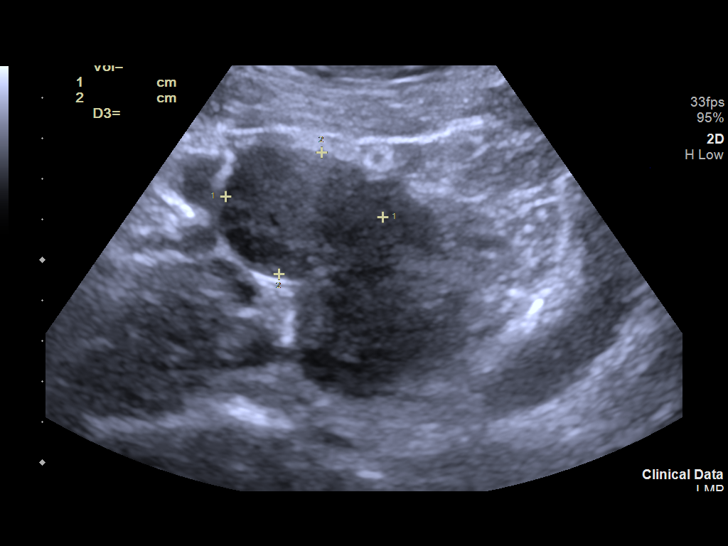
[im 37/110]
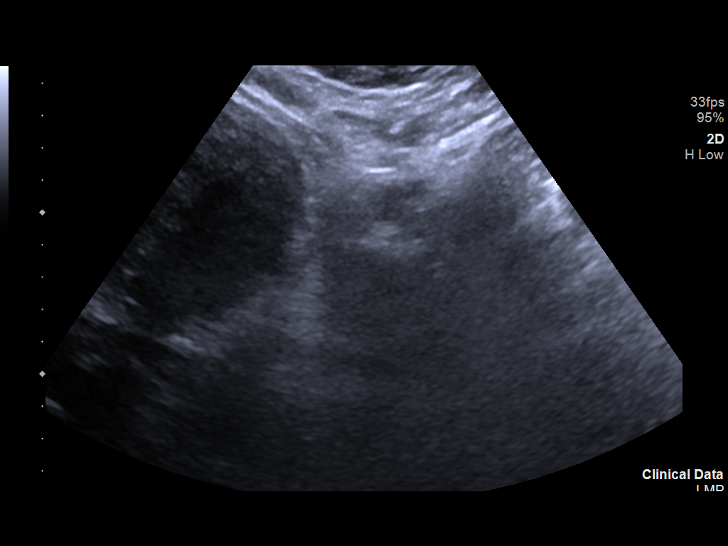
[im 46/110]
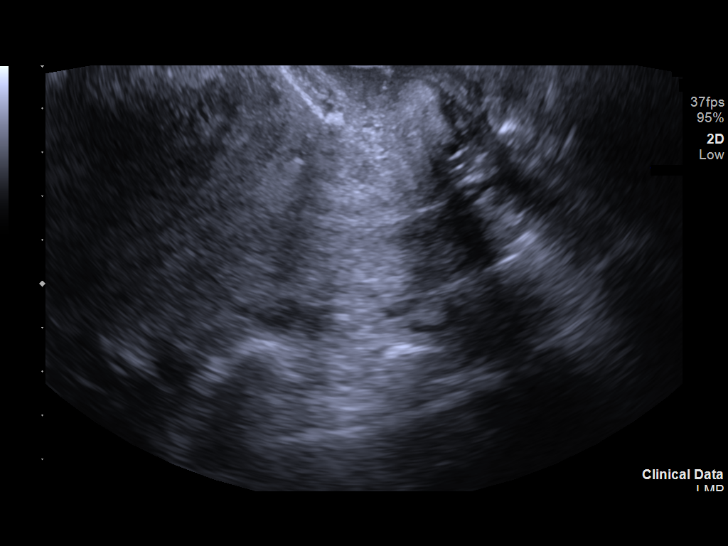
[im 55/110]
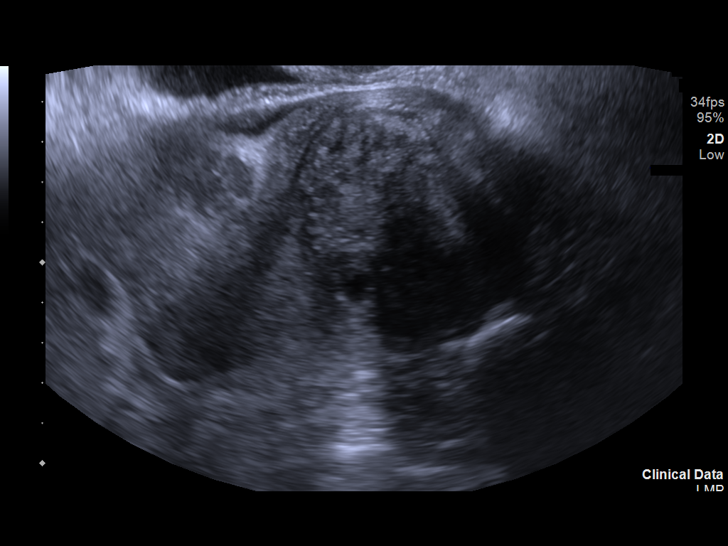
[im 64/110]
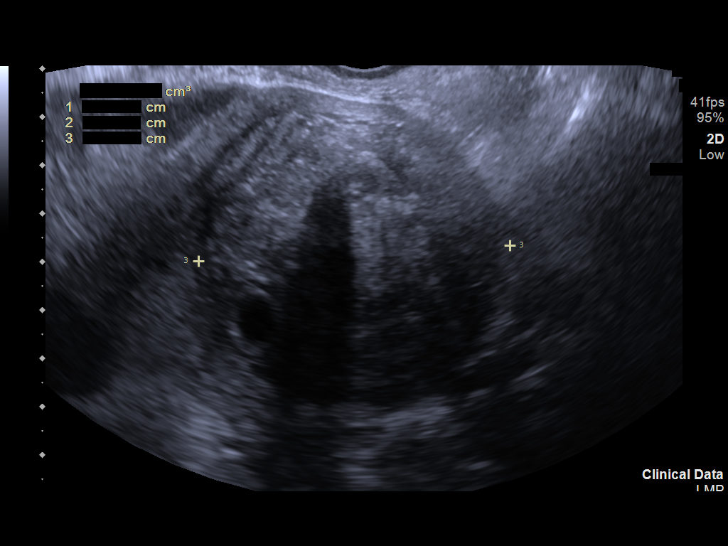
[im 73/110]
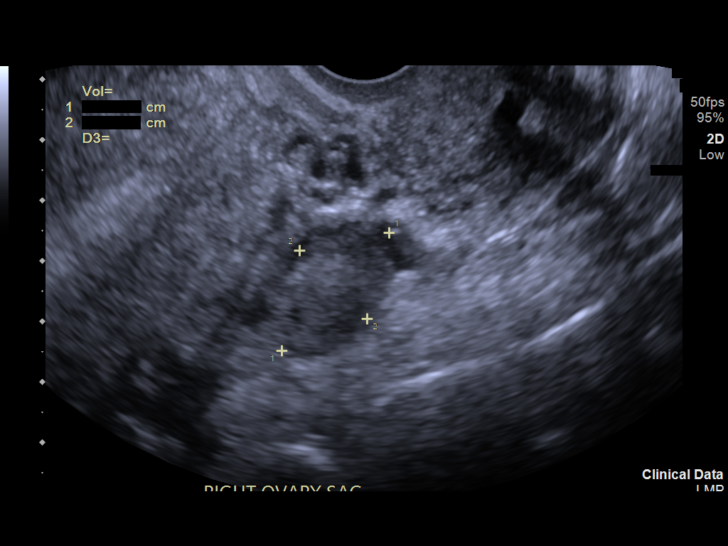
[im 82/110]
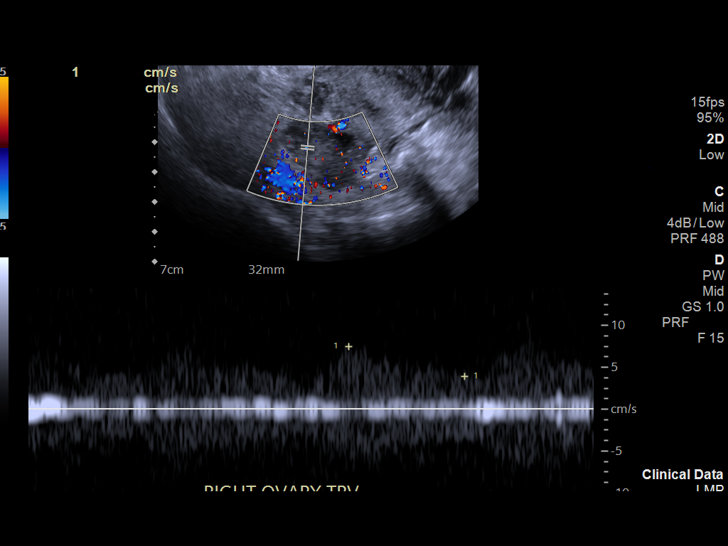
[im 91/110]
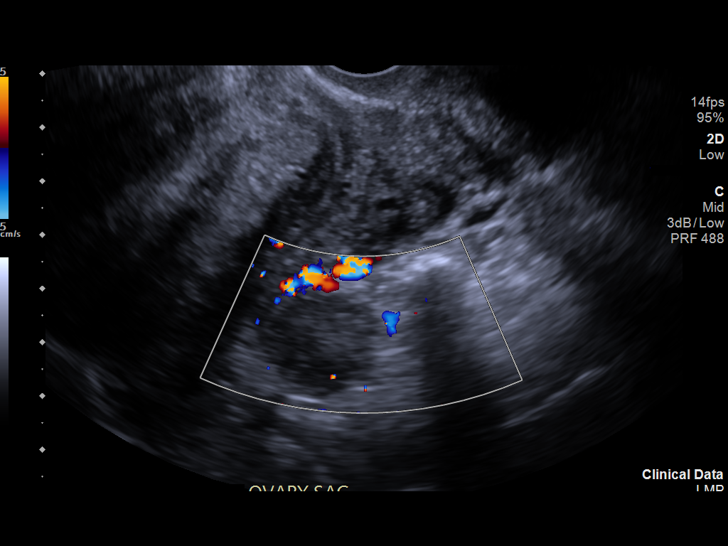
[im 100/110]
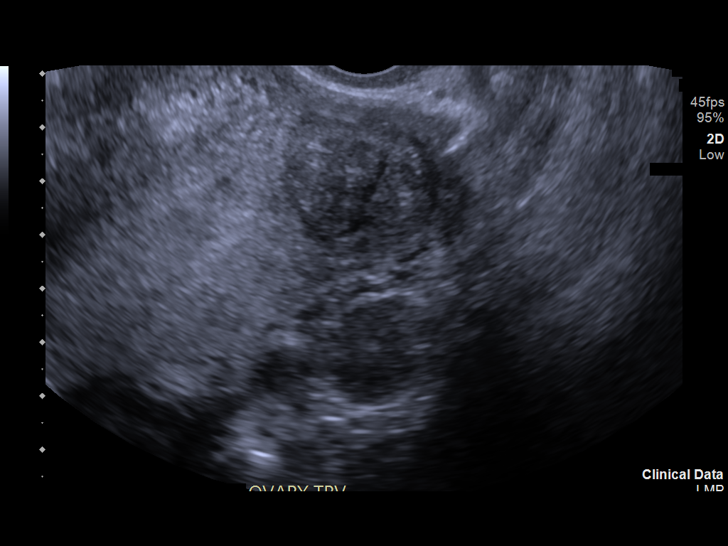
[im 110/110]
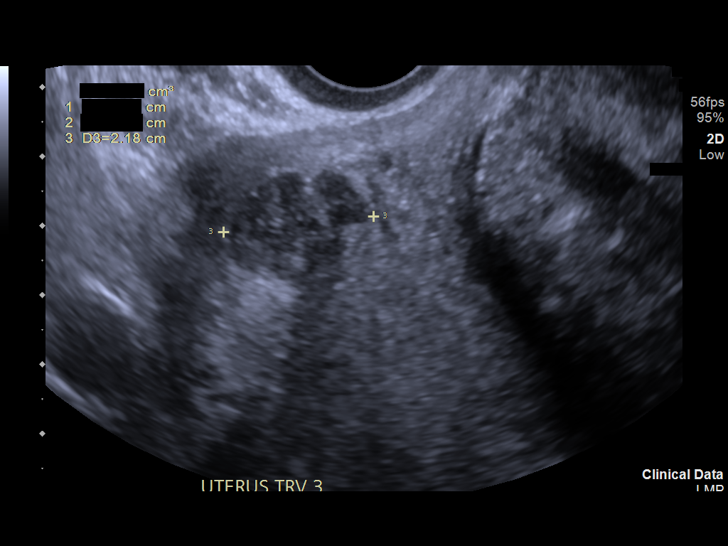

[13 of 25 positions shown; findings below may reference images not displayed]

FINDINGS: Uterus

Measurements: 12.9 x 8.5 x 11.0 cm. = volume: 624 mL. Multiple
uterine fibroids are noted. The largest of these lies to the left
anteriorly and measures 7.5 cm.

Endometrium

Thickness: 10.2 mm..  No focal abnormality visualized.

Right ovary

Measurements: 2.6 x 1.6 x 2.4 cm. = volume: 5.4 mL. Normal
appearance/no adnexal mass.

Left ovary

Measurements: 2.4 x 1.9 x 2.2 cm. = volume: 5.1 mL. Normal
appearance/no adnexal mass.

Pulsed Doppler evaluation of both ovaries demonstrates normal
low-resistance arterial and venous waveforms.

Other findings

No abnormal free fluid.
IMPRESSION: Multiple uterine fibroids without complicating factors.

## 2021-10-26 ENCOUNTER — Inpatient Hospital Stay
Admit: 2021-10-26 | Discharge: 2021-10-26 | Disposition: A | Discharge status: Home / Self Care | Payer: MEDICAID | Attending: Emergency Medicine

## 2021-10-26 DIAGNOSIS — M7061 Trochanteric bursitis, right hip: Secondary | ICD-10-CM

## 2021-10-26 MED ORDER — IBUPROFEN 800 MG TAB
800 mg | Freq: Once | ORAL | Status: AC
Start: 2021-10-26 — End: 2021-10-26
  Administered 2021-10-26: 18:00:00 via ORAL

## 2021-10-26 MED ORDER — METHYLPREDNISOLONE 40 MG/ML SUSP FOR INJECTION
40 mg/mL | Freq: Once | INTRAMUSCULAR | Status: AC
Start: 2021-10-26 — End: 2021-10-26
  Administered 2021-10-26: 18:00:00 via INTRA_ARTICULAR

## 2021-10-26 MED ORDER — BUPIVACAINE (PF) 0.5 % (5 MG/ML) IJ SOLN
0.5 % (5 mg/mL) | Freq: Once | INTRAMUSCULAR | Status: AC
Start: 2021-10-26 — End: 2021-10-26
  Administered 2021-10-26: 18:00:00 via SUBCUTANEOUS

## 2021-10-26 MED FILL — BUPIVACAINE (PF) 0.5 % (5 MG/ML) IJ SOLN: 0.5 % (5 mg/mL) | INTRAMUSCULAR | Qty: 30

## 2021-10-26 MED FILL — METHYLPREDNISOLONE 40 MG/ML SUSP FOR INJECTION: 40 mg/mL | INTRAMUSCULAR | Qty: 1

## 2021-10-26 MED FILL — IBUPROFEN 800 MG TAB: 800 mg | ORAL | Qty: 1

## 2021-10-26 NOTE — ED Notes (Signed)
Patient thinks she has a pinched nerve in her right hip that is causing pain that radiates down her right leg it started last week but was on a cruise and unable to be seen . Pain increases with sitting

## 2021-10-26 NOTE — ED Provider Notes (Signed)
HPI 40 year old female presents ED complaining of right lateral hip pain radiating to the lateral thigh and lateral proximal lower leg.  No weakness or numbness lower extremity.  No fall or trauma.  Patient awoke with this discomfort after an extended day of activity walking and caring for her children.  Similar symptoms in past.    History reviewed. No pertinent past medical history.    History reviewed. No pertinent surgical history.      History reviewed. No pertinent family history.    Social History     Socioeconomic History    Marital status: UNKNOWN     Spouse name: Not on file    Number of children: Not on file    Years of education: Not on file    Highest education level: Not on file   Occupational History    Not on file   Tobacco Use    Smoking status: Every Day     Types: Cigarettes    Smokeless tobacco: Never   Substance and Sexual Activity    Alcohol use: Yes     Comment: occasionally    Drug use: Never    Sexual activity: Not on file   Other Topics Concern    Not on file   Social History Narrative    Not on file     Social Determinants of Health     Financial Resource Strain: Not on file   Food Insecurity: Not on file   Transportation Needs: Not on file   Physical Activity: Not on file   Stress: Not on file   Social Connections: Not on file   Intimate Partner Violence: Not on file   Housing Stability: Not on file         ALLERGIES: Patient has no known allergies.    Review of Systems   All other systems reviewed and are negative.    Vitals:    10/26/21 1111   BP: (!) 164/90   Pulse: 89   Resp: 22   Temp: 98.6 ??F (37 ??C)   SpO2: 100%   Weight: 123.4 kg (272 lb)   Height: 5\' 9"  (1.753 m)          Middle-age AA female moderate discomfort  Physical Exam  Vitals and nursing note reviewed.   Constitutional:       General: She is not in acute distress.     Appearance: Normal appearance. She is normal weight. She is not ill-appearing.   HENT:      Head: Normocephalic and atraumatic.   Abdominal:      General:  Abdomen is flat. Bowel sounds are normal. There is no distension.      Palpations: Abdomen is soft.      Tenderness: There is no abdominal tenderness. There is no guarding.   Musculoskeletal:         General: Tenderness present. No swelling, deformity or signs of injury. Normal range of motion.      Cervical back: Normal range of motion and neck supple.      Comments: The right greater trochanter is tender to palpation flexion of the thigh with internal rotation causes significant pain; the hip joint otherwise has a normal range of motion there is no warmth erythema or inflammation the overlying skin.  The right knee is normal exam of the right ankle normal the lower extremity is otherwise neurovascular intact with no evidence of edema negative Homans no inner thigh tenderness   Neurological:  Mental Status: She is alert.        MDM  Clinically with lateral band or greater trochanteric bursitis we will plan for steroid injection.       Procedures  Injection of right greater trochanter bursa: Prepped with chlorhexidine verbal consent injected with 40 mg of Depo-Medrol mixed with 2 cc of Marcaine patient tolerated well
